# Patient Record
Sex: Male | Born: 1960 | Race: White | Hispanic: No | Smoking: Former smoker
Health system: Southern US, Community
[De-identification: ages and names within clinical notes are randomized; demographics above are authoritative.]

## PROBLEM LIST (undated history)

## (undated) DIAGNOSIS — I864 Gastric varices: Secondary | ICD-10-CM

## (undated) DIAGNOSIS — I2510 Atherosclerotic heart disease of native coronary artery without angina pectoris: Secondary | ICD-10-CM

## (undated) DIAGNOSIS — I5189 Other ill-defined heart diseases: Secondary | ICD-10-CM

## (undated) DIAGNOSIS — I509 Heart failure, unspecified: Secondary | ICD-10-CM

## (undated) DIAGNOSIS — I272 Pulmonary hypertension, unspecified: Secondary | ICD-10-CM

## (undated) DIAGNOSIS — E785 Hyperlipidemia, unspecified: Secondary | ICD-10-CM

## (undated) DIAGNOSIS — E119 Type 2 diabetes mellitus without complications: Secondary | ICD-10-CM

## (undated) DIAGNOSIS — J449 Chronic obstructive pulmonary disease, unspecified: Secondary | ICD-10-CM

## (undated) DIAGNOSIS — G4730 Sleep apnea, unspecified: Secondary | ICD-10-CM

## (undated) DIAGNOSIS — R188 Other ascites: Secondary | ICD-10-CM

## (undated) DIAGNOSIS — I8500 Esophageal varices without bleeding: Secondary | ICD-10-CM

## (undated) DIAGNOSIS — E669 Obesity, unspecified: Secondary | ICD-10-CM

## (undated) DIAGNOSIS — K5190 Ulcerative colitis, unspecified, without complications: Secondary | ICD-10-CM

## (undated) DIAGNOSIS — I10 Essential (primary) hypertension: Secondary | ICD-10-CM

## (undated) DIAGNOSIS — C4490 Unspecified malignant neoplasm of skin, unspecified: Secondary | ICD-10-CM

## (undated) DIAGNOSIS — N529 Male erectile dysfunction, unspecified: Secondary | ICD-10-CM

## (undated) DIAGNOSIS — K7460 Unspecified cirrhosis of liver: Secondary | ICD-10-CM

## (undated) DIAGNOSIS — R05 Cough: Secondary | ICD-10-CM

## (undated) DIAGNOSIS — D638 Anemia in other chronic diseases classified elsewhere: Principal | ICD-10-CM

## (undated) DIAGNOSIS — R0600 Dyspnea, unspecified: Secondary | ICD-10-CM

## (undated) DIAGNOSIS — D649 Anemia, unspecified: Secondary | ICD-10-CM

## (undated) DIAGNOSIS — R059 Cough, unspecified: Secondary | ICD-10-CM

## (undated) DIAGNOSIS — M549 Dorsalgia, unspecified: Secondary | ICD-10-CM

## (undated) DIAGNOSIS — C140 Malignant neoplasm of pharynx, unspecified: Secondary | ICD-10-CM

## (undated) HISTORY — DX: Unspecified malignant neoplasm of skin, unspecified: C44.90

## (undated) HISTORY — DX: Heart failure, unspecified: I50.9

## (undated) HISTORY — DX: Obesity, unspecified: E66.9

## (undated) HISTORY — DX: Anemia in other chronic diseases classified elsewhere: D63.8

## (undated) HISTORY — PX: KNEE ARTHROSCOPY: SUR90

## (undated) HISTORY — PX: ANGIOPLASTY: SHX39

## (undated) HISTORY — PX: TONSILLECTOMY: SUR1361

## (undated) HISTORY — DX: Ulcerative colitis, unspecified, without complications: K51.90

## (undated) HISTORY — PX: ROTATOR CUFF REPAIR: SHX139

## (undated) HISTORY — DX: Male erectile dysfunction, unspecified: N52.9

## (undated) HISTORY — DX: Sleep apnea, unspecified: G47.30

## (undated) HISTORY — DX: Atherosclerotic heart disease of native coronary artery without angina pectoris: I25.10

## (undated) HISTORY — PX: COLONOSCOPY: SHX174

## (undated) HISTORY — PX: NASAL SEPTUM SURGERY: SHX37

## (undated) HISTORY — DX: Dorsalgia, unspecified: M54.9

## (undated) HISTORY — DX: Cough: R05

## (undated) HISTORY — DX: Chronic obstructive pulmonary disease, unspecified: J44.9

## (undated) HISTORY — DX: Cough, unspecified: R05.9

## (undated) HISTORY — DX: Type 2 diabetes mellitus without complications: E11.9

## (undated) HISTORY — DX: Anemia, unspecified: D64.9

## (undated) HISTORY — PX: ESOPHAGOGASTRODUODENOSCOPY: SHX1529

## (undated) HISTORY — DX: Hyperlipidemia, unspecified: E78.5

## (undated) HISTORY — DX: Dyspnea, unspecified: R06.00

## (undated) HISTORY — DX: Essential (primary) hypertension: I10

---

## 1993-08-18 HISTORY — PX: CORONARY ARTERY BYPASS GRAFT: SHX141

## 2002-11-18 ENCOUNTER — Encounter: Admission: RE | Admit: 2002-11-18 | Discharge: 2002-11-18 | Payer: Self-pay | Admitting: Orthopaedic Surgery

## 2002-11-21 ENCOUNTER — Ambulatory Visit (HOSPITAL_BASED_OUTPATIENT_CLINIC_OR_DEPARTMENT_OTHER): Admission: RE | Admit: 2002-11-21 | Discharge: 2002-11-22 | Payer: Self-pay | Admitting: Orthopaedic Surgery

## 2004-01-04 ENCOUNTER — Ambulatory Visit (HOSPITAL_COMMUNITY): Admission: RE | Admit: 2004-01-04 | Discharge: 2004-01-04 | Payer: Self-pay | Admitting: Family Medicine

## 2005-08-18 HISTORY — PX: CHOLECYSTECTOMY: SHX55

## 2006-08-07 ENCOUNTER — Emergency Department (HOSPITAL_COMMUNITY): Admission: EM | Admit: 2006-08-07 | Discharge: 2006-08-07 | Payer: Self-pay | Admitting: Emergency Medicine

## 2009-08-22 ENCOUNTER — Ambulatory Visit (HOSPITAL_COMMUNITY)
Admission: RE | Admit: 2009-08-22 | Discharge: 2009-08-22 | Payer: Self-pay | Source: Home / Self Care | Admitting: Internal Medicine

## 2010-09-08 ENCOUNTER — Encounter: Payer: Self-pay | Admitting: Internal Medicine

## 2011-01-03 NOTE — Op Note (Signed)
NAME:  Antonio Stevens, Antonio Stevens                   ACCOUNT NO.:  [id number]   MEDICAL RECORD NO.:  [id number]                   PATIENT TYPE:  AMB   LOCATION:  DSC                                  FACILITY:  [facility]   PHYSICIAN:  Mark C. Ophelia Charter, M.D.                 DATE OF BIRTH:  [birth year]   DATE OF PROCEDURE:  11/21/2002  DATE OF DISCHARGE:                                 OPERATIVE REPORT   PREOPERATIVE DIAGNOSIS:  Left knee anterior cruciate ligament tear.   POSTOPERATIVE DIAGNOSIS:  Left knee anterior cruciate ligament tear with  posterior lateral meniscal tear.   PROCEDURE:  1. Diagnostic and operative arthroscopy, left knee.  2. Partial lateral meniscectomy.  3. Allograft bone-tendon-bone anterior cruciate ligament reconstruction.   SURGEON:  Mark C. Ophelia Charter, M.D.   ASSISTANT:  Genene Churn. Barry Dienes, P.A.-C.   ANESTHESIA:  General plus preoperative femoral nerve block plus 30 cc of  0.25% Marcaine with knee block  at the end of the case.   DESCRIPTION OF PROCEDURE:  After the induction of general anesthesia and  oral tracheal intubation, a proximal thigh tourniquet was placed over the  orange leg holder. Standard prepping was performed with Duraprep including  the toes before the stockinet and usual arthroscopic  sheets and drapes were  applied.   The arthroscope was placed. Inflow was placed thorough a superolateral  portal. The knee was examined. There was a positive Lachman, positive pivot  shift and still some laxity of the medial collateral ligament.   The arthroscope was introduced and the medial meniscus was normal. There  were some mild patellofemoral changes. No loose bodies. The lateral  compartment showed a tear of the posterior horn which was trimmed back with  straight flat baskets and then the Coude shaver.   The ACL showed detachment off the femoral side with scarring down wrapping  around behind the PCL. The ligament was carefully teased away from the PL  and  was resected due to the laxity and lack of the femoral attachment. The  stump was debrided and the assistant surgeon which was physician assistant  Zonia Kief, prepared the allograft by resecting the bone, sliding it  through a 10 sizer, drilling a hole and placing fiber wire  as well as #5  Tycron sutures through the hole on each end.   A 10-mm hole was drilled on the tibial side, exiting out through the stump  of the cruciate with care taken for positioning. A notchplasty was performed  and using the femoral notch handle, a pin was drilled up followed by  overdrilling with a 10-mm drill hole to a depth of 35.   The graft was marked with the sterile skin marker, and using the 2-pin  passer, the graft was passed up through the tibia hole through the knee. The  notcher was used on the femoral side. Nitinol wire was placed and a 7 x 25  mm  screw was inserted, countersunk 1 mm. Excellent grab on the graft. On the  tibial side there was slight prominence of the graft. The graft was  shortened slightly and it was felt best to tie it over a post.   Using a fiber wire sutured through the graft, the fiber wire was sutured to  the hole in the bone and then off of the #5 suture the 3 sutures were split  with 1 arm of the suture on each side tied around the post. The post was  tightened down holding the knot securely and then additional knots were  placed.   After irrigation with saline solution, 2-0 Vicryl sutures were placed in the  tibial tunnel hole. Nylon sutures and skin suture, some additional Marcaine  infiltration with a postoperative dressing and knee immobilizer.   The patient will stay overnight for IV pain medication due to his cardiac  condition. The patient intraoperatively received some nitroglycerin and also  some pressors to keep his blood pressure stable.                                               Mark C. Ophelia Charter, M.D.    MCY/MEDQ  D:  11/21/2002  T:  11/21/2002  Job:   [id number]

## 2012-09-22 ENCOUNTER — Other Ambulatory Visit (HOSPITAL_COMMUNITY): Payer: Self-pay | Admitting: Internal Medicine

## 2012-09-22 DIAGNOSIS — R945 Abnormal results of liver function studies: Secondary | ICD-10-CM

## 2012-09-22 DIAGNOSIS — R17 Unspecified jaundice: Secondary | ICD-10-CM

## 2012-09-24 ENCOUNTER — Ambulatory Visit (HOSPITAL_COMMUNITY)
Admission: RE | Admit: 2012-09-24 | Discharge: 2012-09-24 | Disposition: A | Discharge status: Home / Self Care | Payer: Medicare PPO | Source: Ambulatory Visit | Attending: Internal Medicine | Admitting: Internal Medicine

## 2012-09-24 DIAGNOSIS — R935 Abnormal findings on diagnostic imaging of other abdominal regions, including retroperitoneum: Secondary | ICD-10-CM | POA: Insufficient documentation

## 2012-09-24 DIAGNOSIS — R945 Abnormal results of liver function studies: Secondary | ICD-10-CM

## 2012-09-24 DIAGNOSIS — K769 Liver disease, unspecified: Secondary | ICD-10-CM | POA: Insufficient documentation

## 2012-09-24 DIAGNOSIS — R748 Abnormal levels of other serum enzymes: Secondary | ICD-10-CM | POA: Insufficient documentation

## 2012-09-24 DIAGNOSIS — R17 Unspecified jaundice: Secondary | ICD-10-CM

## 2012-10-04 ENCOUNTER — Other Ambulatory Visit (HOSPITAL_COMMUNITY): Payer: Self-pay | Admitting: Internal Medicine

## 2012-10-04 DIAGNOSIS — K7460 Unspecified cirrhosis of liver: Secondary | ICD-10-CM

## 2012-10-04 DIAGNOSIS — I81 Portal vein thrombosis: Secondary | ICD-10-CM

## 2012-10-05 ENCOUNTER — Other Ambulatory Visit (HOSPITAL_COMMUNITY): Payer: Self-pay | Admitting: Internal Medicine

## 2012-10-05 DIAGNOSIS — I81 Portal vein thrombosis: Secondary | ICD-10-CM

## 2012-10-06 ENCOUNTER — Ambulatory Visit (HOSPITAL_COMMUNITY)
Admission: RE | Admit: 2012-10-06 | Discharge: 2012-10-06 | Disposition: A | Discharge status: Home / Self Care | Payer: Medicare PPO | Source: Ambulatory Visit | Attending: Internal Medicine | Admitting: Internal Medicine

## 2012-10-06 DIAGNOSIS — I81 Portal vein thrombosis: Secondary | ICD-10-CM | POA: Insufficient documentation

## 2012-10-06 DIAGNOSIS — R109 Unspecified abdominal pain: Secondary | ICD-10-CM | POA: Insufficient documentation

## 2012-10-06 DIAGNOSIS — R161 Splenomegaly, not elsewhere classified: Secondary | ICD-10-CM | POA: Insufficient documentation

## 2012-10-06 MED ORDER — IOHEXOL 300 MG/ML  SOLN
100.0000 mL | Freq: Once | INTRAMUSCULAR | Status: AC | PRN
  Administered 2012-10-06: 100 mL via INTRAVENOUS

## 2013-03-03 ENCOUNTER — Other Ambulatory Visit: Payer: Self-pay | Admitting: Internal Medicine

## 2013-03-03 DIAGNOSIS — G479 Sleep disorder, unspecified: Secondary | ICD-10-CM

## 2013-03-10 ENCOUNTER — Other Ambulatory Visit (HOSPITAL_COMMUNITY): Payer: Self-pay | Admitting: Internal Medicine

## 2013-03-10 DIAGNOSIS — R1310 Dysphagia, unspecified: Secondary | ICD-10-CM

## 2013-03-16 ENCOUNTER — Ambulatory Visit (HOSPITAL_COMMUNITY)
Admission: RE | Admit: 2013-03-16 | Discharge: 2013-03-16 | Disposition: A | Discharge status: Home / Self Care | Payer: Medicare PPO | Source: Ambulatory Visit | Attending: Internal Medicine | Admitting: Internal Medicine

## 2013-03-16 DIAGNOSIS — R1310 Dysphagia, unspecified: Secondary | ICD-10-CM | POA: Insufficient documentation

## 2013-03-16 DIAGNOSIS — R6889 Other general symptoms and signs: Secondary | ICD-10-CM | POA: Insufficient documentation

## 2013-03-28 ENCOUNTER — Ambulatory Visit: Payer: Medicare PPO | Attending: Internal Medicine | Admitting: Sleep Medicine

## 2013-03-28 DIAGNOSIS — G4733 Obstructive sleep apnea (adult) (pediatric): Secondary | ICD-10-CM | POA: Insufficient documentation

## 2013-03-28 DIAGNOSIS — G479 Sleep disorder, unspecified: Secondary | ICD-10-CM

## 2013-03-29 NOTE — Patient Instructions (Signed)
CPAP and BIPAP CPAP and BIPAP are methods of helping you breathe. CPAP stands for "continuous positive airway pressure." BIPAP stands for "bi-level positive airway pressure." Both CPAP and BIPAP are provided by a small machine with a flexible plastic tube that attaches to a plastic mask that goes over your nose or mouth. Air is blown into your air passages through your nose or mouth. This helps to keep your airways open and helps to keep you breathing well. The amount of pressure that is used to blow the air into your air passages can be set on the machine. The pressure setting is based on your needs. With CPAP, the amount of pressure stays the same while you breathe in and out. With BIPAP, the amount of pressure changes when you inhale and exhale. Your caregiver will recommend whether CPAP or BIPAP would be more helpful for you.  CPAP and BIPAP can be helpful for both adults and children with:  Sleep apnea.  Chronic Obstructive Pulmonary Disease (COPD), a condition like emphysema.  Diseases which weaken the muscles of the chest such as muscular dystrophy or neurological diseases.  Other problems that cause breathing to be weak or difficult. USE OF CPAP OR BIPAP The respiratory therapist or technician will help you get used to wearing the mask. Some people feel claustrophobic (a trapped or closed in feeling) at first, because the mask needs to be fairly snug on your face.   It may help you to get used to the mask gradually, by first holding the mask loosely over your nose or mouth using a low pressure setting on the machine. Gradually the mask can be applied more snugly with increased pressure. You can also gradually increase the amount of time the mask is used.  People with sleep apnea will use the mask and machine at night when they are sleeping. Others, like those with ALS or other breathing difficulties, may need the CPAP or BIPAP all the time.  If the first mask you try does not fit well, or  is uncomfortable, there are other types and sizes that can be tried.  If you tend to breathe through your mouth, a chin strap may be applied to help keep your mouth closed (if you are using a nasal mask).  The CPAP and BIPAP machines have alarms that may sound if the mask comes off or develops a leak.  You should not eat or drink while the CPAP or BIPAP is on. Food or fluids could get pushed into your lungs by the pressure of the CPAP or BIPAP. Sometimes CPAP or BIPAP machines are ordered for home use. If you are going to use the CPAP or BIPAP machine at home, follow these instructions  CPAP or BIPAP machines can be rented or purchased through home health care companies. There are many different brands of machines available. If you rent a machine before purchasing you may find which particular machine works well for you.  Ask questions if there is something you do not understand when picking out your machine.  Place your CPAP or BIPAP machine on a secure table or stand near an electrical outlet.  Know where the On/Off switch is.  Follow your doctor's instructions for how to set the pressure on your machine and when you should use it.  Do not smoke! Tobacco smoke residue can damage the machine. SEEK IMMEDIATE MEDICAL CARE IF:   You have redness or open areas around your nose or mouth.  You have trouble operating   the CPAP or BIPAP machine.  You cannot tolerate wearing the CPAP or BIPAP mask.  You have any questions or concerns. Document Released: 05/02/2004 Document Revised: 10/27/2011 Document Reviewed: 08/01/2008 [employer] Patient Information 2014 [employer].  

## 2013-03-31 NOTE — Procedures (Signed)
[facility] [name], MD     [email or web address]        [name], [name]              ACCOUNT NO.:  [id number]  MEDICAL RECORD NO.:  [id number]          PATIENT TYPE:  OUT  LOCATION:  SLEEP LAB                     FACILITY:  [facility]  PHYSICIAN:  Andrae Claunch A. Gerilyn Pilgrim, M.D. DATE OF BIRTH:  09/25/1960  DATE OF STUDY:  03/28/2013                           NOCTURNAL POLYSOMNOGRAM  REFERRING PHYSICIAN:  Catalina Pizza, M.D.  INDICATION:  A 52 year old man who presents with hypersomnia, fatigue, obesity and snoring.   MEDICATIONS:  Hydrochlorothiazide, aspirin, Nexium, fluoxetine, folic acid, diclofenac and Actos.  EPWORTH SLEEPINESS SCALE:  12.  BMI:  40.  ARCHITECTURAL SUMMARY:  This is a split-night recording with initial portion being a diagnostic and the second portion a titration recording. The total recording time is 384 minutes.  Sleep efficiency is 7%, sleep latency is 16 minutes, REM latency is 254 minutes.  RESPIRATORY SUMMARY:  Baseline oxygen saturation is 94, lowest saturation 73 during non-REM sleep, although he did decide to 80 during REM sleep.  Diagnostic AHI is 73.  The patient was placed on positive pressure starting at 5 and increased to 12.  The optimal pressure is 12 with resolution of obstructive events.  The patient did have persistent desaturations to the 80s and as low as 81 even on the high positive pressure.  These desaturations were without obstructive events indicative of hypoventilation syndrome.  LIMB MOVEMENT SUMMARY:  PLM index 0.  ELECTROCARDIOGRAM SUMMARY:  No significant dysrhythmias are observed.  IMPRESSION: 1. Severe obstructive sleep apnea syndrome which responds well to a     CPAP of 12. 2. REM related hypoventilation syndrome.  RECOMMENDATION:  CPAP of 12.  However, given the REM related hypoventilation syndrome, the patient should have a nocturnal oximetry on positive pressure after a month's treatment to assess the  need for nocturnal oxygen. If he meets Medicare criteria for nocturnal oxygen, he patient should be placed on supplemental 2 L of oxygen at nighttime along with his CPAP.  Thank you for this referral.  SLEEP ARCHITECTURE:  RESPIRATORY DATA:  OXYGEN DATA:  CARDIAC DATA:  MOVEMENT-PARASOMNIA:  IMPRESSIONS-RECOMMENDATIONS:     Adonay Scheier A. Gerilyn Pilgrim, M.D.    KAD/MEDQ  D:  03/31/2013 08:44:13  T:  03/31/2013 08:57:07  Job:  [id number]

## 2013-04-14 ENCOUNTER — Ambulatory Visit (HOSPITAL_COMMUNITY): Payer: Medicare Other

## 2013-04-15 ENCOUNTER — Encounter (HOSPITAL_COMMUNITY): Payer: Self-pay

## 2013-04-15 ENCOUNTER — Encounter (HOSPITAL_COMMUNITY): Payer: Medicare PPO | Attending: Hematology and Oncology

## 2013-04-15 VITALS — BP 127/69 | HR 79 | Temp 98.8°F | Resp 20 | Ht 72.0 in | Wt 283.7 lb

## 2013-04-15 DIAGNOSIS — D649 Anemia, unspecified: Secondary | ICD-10-CM

## 2013-04-15 DIAGNOSIS — E782 Mixed hyperlipidemia: Secondary | ICD-10-CM

## 2013-04-15 DIAGNOSIS — E755 Other lipid storage disorders: Secondary | ICD-10-CM

## 2013-04-15 LAB — COMPREHENSIVE METABOLIC PANEL
ALT: 22 U/L (ref 0–53)
AST: 30 U/L (ref 0–37)
Calcium: 9.2 mg/dL (ref 8.4–10.5)
GFR calc Af Amer: >90 mL/min (ref >90)
GFR calc non Af Amer: >90 mL/min (ref >90)
Total Bilirubin: 1.1 mg/dL (ref 0.3–1.2)
Total Protein: 7.4 g/dL (ref 6.0–8.3)

## 2013-04-15 LAB — HAPTOGLOBIN: Haptoglobin: 54 mg/dL (ref 45–215)

## 2013-04-15 LAB — CBC WITH DIFFERENTIAL/PLATELET
Eosinophils Absolute: 0.2 10*3/uL (ref 0.0–0.7)
Eosinophils Relative: 4 % (ref 0–5)
HCT: 28.1 % — ABNORMAL LOW (ref 39.0–52.0)
Hemoglobin: 10.1 g/dL — ABNORMAL LOW (ref 13.0–17.0)
Lymphocytes Relative: 28 % (ref 12–46)
MCV: 121.6 fL — ABNORMAL HIGH (ref 78.0–100.0)
Monocytes Relative: 20 % — ABNORMAL HIGH (ref 3–12)
WBC: 4.4 10*3/uL (ref 4.0–10.5)

## 2013-04-15 LAB — IRON AND TIBC
Saturation Ratios: 26 % (ref 20–55)
TIBC: 397 ug/dL (ref 215–435)

## 2013-04-15 LAB — LACTATE DEHYDROGENASE: LDH: 215 U/L (ref 94–250)

## 2013-04-15 NOTE — Progress Notes (Signed)
Patient History and Physical   Antonio Stevens [id number] 1961/04/19 52 y.o. 04/15/2013  Referring MD: Catalina Pizza, MD.  Chief Complaint: Anemia.   HPI:  Antonio Stevens is a 52 year old man with history of multiple co morbidities listed below under past medical history.  We were kindly asked by [name] to evaluate him for anemia.  I have reviewed the records from Dr [name] office.  Patient more recently on 03/01/2013 was noted to have a hemoglobin of 10.8, with essentially normal WBC and platelet count. Differential counts were essentially unremarkable. In Jan 2014  Hb was  Near normal at 12.9 g/dL . Approximately  in September of 2013 , hemoglobin was normal at 14 with essentially normal WBC and platelets also and normal differentials. He tells me that he has had " fluid build up" in his body and has been working with his cardiologist at [facility] optimize his diuretic therapy. He states that she feels tired and chronically short of breath even though he smokes one pack of cigarettes a day.  He denies  bloody stool or melena or hematuria.  He did state that  He had Ulcerative colitis that is under good control for the past 3-4 years. He does not recall  exactly when his last colonoscopy was but he states that he is not due for one at this time.  PMH: Past Medical History  Diagnosis Date  . CHF (congestive heart failure)   . CAD (coronary artery disease)   . Hypertension   . Anemia   . Hyperlipidemia   . Sleep apnea   . Obesity   . Diabetes mellitus without complication   . Ulcerative colitis   . Dyspnea   . Cough   . Back pain   . COPD (chronic obstructive pulmonary disease)   . Erectile dysfunction   . Skin cancer     Past Surgical History  Procedure Laterality Date  . Cholecystectomy  2007  . Coronary artery bypass graft  1995  . Angioplasty      Allergies: No Known Allergies  Medications: Current outpatient prescriptions:albuterol (PROVENTIL HFA;VENTOLIN HFA) 108 (90 BASE)  MCG/ACT inhaler, Inhale 2 puffs into the lungs every 6 (six) hours as needed for wheezing., Disp: , Rfl: ;  aspirin EC 81 MG tablet, Take 81 mg by mouth daily., Disp: , Rfl: ;  atorvastatin (LIPITOR) 40 MG tablet, Take 40 mg by mouth daily., Disp: , Rfl: ;  carvedilol (COREG) 3.125 MG tablet, Take 3.125 mg by mouth 2 (two) times daily with a meal., Disp: , Rfl:  docusate sodium (COLACE) 100 MG capsule, Take 100 mg by mouth daily., Disp: , Rfl: ;  esomeprazole (NEXIUM) 40 MG capsule, Take 40 mg by mouth daily before breakfast., Disp: , Rfl: ;  FLUoxetine (PROZAC) 20 MG capsule, Take 20 mg by mouth daily., Disp: , Rfl: ;  fluticasone (FLONASE) 50 MCG/ACT nasal spray, Place 2 sprays into the nose 2 (two) times daily as needed for rhinitis., Disp: , Rfl:  folic acid (FOLVITE) 1 MG tablet, Take 1 mg by mouth daily., Disp: , Rfl: ;  mercaptopurine (PURINETHOL) 50 MG tablet, Take 50 mg by mouth daily. 2 pills daily. Give on an empty stomach 1 hour before or 2 hours after meals. Caution: Chemotherapy., Disp: , Rfl: ;  nitroGLYCERIN (NITROSTAT) 0.4 MG SL tablet, Place 0.4 mg under the tongue every 5 (five) minutes as needed for chest pain., Disp: , Rfl:  pioglitazone (ACTOS) 30 MG tablet, Take 30 mg by mouth  daily., Disp: , Rfl: ;  spironolactone (ALDACTONE) 25 MG tablet, Take 12.5 mg by mouth daily. Take one half 25 mg tablet daily, Disp: , Rfl: ;  tadalafil (CIALIS) 5 MG tablet, Take 5 mg by mouth as needed for erectile dysfunction., Disp: , Rfl: ;  torsemide (DEMADEX) 20 MG tablet, Take 40 mg by mouth 2 (two) times daily. Take two 20 mg (40 mg) two times daily, Disp: , Rfl:  vardenafil (LEVITRA) 20 MG tablet, Take 20 mg by mouth as needed for erectile dysfunction., Disp: , Rfl: ;  vitamin B-12 (CYANOCOBALAMIN) 1000 MCG tablet, Take 1,000 mcg by mouth daily., Disp: , Rfl:    Social History:   reports that he has been smoking Cigarettes.  He has been smoking about 1.00 pack per day. He has never used smokeless  tobacco. He reports that he does not drink alcohol or use illicit drugs.  Has at least 60 pack years of smoking. Lives alone. Worked as a Higher education careers adviser. Divorced. No children. Has a brother and 2 sister that live within 15 miles. In good term with his sister and talk to her regularly [name] [phone])  Family History: Family History  Problem Relation Age of Onset  . Heart attack Father   . Diabetes Brother   . Heart attack Brother   . Emphysema Maternal Grandfather     Review of Systems: 14 point review of system is as in the history above otherwise negative.   Physical Exam: Blood pressure 127/69, pulse 79, temperature 98.8 F (37.1 C), temperature source Oral, resp. rate 20, height 6' (1.829 m), weight 283 lb 11.2 oz (128.685 kg). GENERAL: No distress, . Obese.  SKIN:  No rashes or significant lesions , no ecchymosis or petechia or rash. 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 HEAD: Normocephalic, No masses, lesions, tenderness or abnormalities  EYES: Conjunctiva are pink , non-injected and no jaundice. ENT: External ears normal ,lips, buccal mucosa, and tongue normal and mucous membranes are moist . LYMPH: No palpable lymphadenopathy,  In the neck supraclavicular area. LUNGS: Clear to auscultation , no crackles or wheezes HEART: regular rate & rhythm, no murmurs, no gallops, S1 normal and S2 normal  ABDOMEN: Abdomen soft, non-tender, normal bowel sounds, no masses or organomegaly and no hepatosplenomegaly palpable.  EXTREMITIES: No edema, no skin discoloration or tenderness. NEURO: Alert & oriented.     Lab Results: No results found for this basename: WBC, HGB, HCT, MCV, PLT     Chemistry   No results found for this basename: NA, K, CL, CO2, BUN, CREATININE, GLU   No results found for this basename: CALCIUM, ALKPHOS, AST, ALT, BILITOT         Radiological Studies: No results found.    Impression: Antonio Stevens has relatively new onset anemia,  etiology of which is unclear from available data.  I will initiate basic anemia work up which will include iron studies, B12 level and hemolytic workup.  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  Recommendations: 1. CBC ,CMP, B12, LDH, haptoglobin, serum protein electrophoresis and light chain assay. 2.  I will review peripheral blood smear. 3. Patient will return to clinic in 2 weeks to discuss the results and see if further workup is indicated. 4. He will continue his routine care with Dr. Timothy Lasso and [facility]. 5.  Further recommendations pending follow up visit.  All questions were satisfactorily answered.She knows to call if she has any concern.  I spent 50% of the time was spent counseling the patient face to face. The total time spent in the appointment was 60 minutes.   Sherral Hammers, MD FACP. Hematology/Oncology.   Thank you [name] for the opportunity to participate in the care of this patient.  Marland Kitchen

## 2013-04-15 NOTE — Patient Instructions (Addendum)
[facility] Discharge Instructions  RECOMMENDATIONS MADE BY THE CONSULTANT AND ANY TEST RESULTS WILL BE SENT TO YOUR REFERRING PHYSICIAN.  EXAM FINDINGS BY THE PHYSICIAN TODAY AND SIGNS OR SYMPTOMS TO REPORT TO CLINIC OR PRIMARY PHYSICIAN: Exam and discussion by MD.  We will check some blood work today and will see you back in 2 weeks to discuss results.  MEDICATIONS PRESCRIBED:  none    SPECIAL INSTRUCTIONS/FOLLOW-UP: Follow-up in 2 weeks.  Thank you for choosing [facility] to provide your oncology and hematology care.  To afford each patient quality time with our providers, please arrive at least 15 minutes before your scheduled appointment time.  With your help, our goal is to use those 15 minutes to complete the necessary work-up to ensure our physicians have the information they need to help with your evaluation and healthcare recommendations.    Effective January 1st, 2014, we ask that you re-schedule your appointment with our physicians should you arrive 10 or more minutes late for your appointment.  We strive to give you quality time with our providers, and arriving late affects you and other patients whose appointments are after yours.    Again, thank you for choosing [facility].  Our hope is that these requests will decrease the amount of time that you wait before being seen by our physicians.       _____________________________________________________________  Should you have questions after your visit to [facility], please contact our office at [phone] between the hours of 8:30 a.m. and 5:00 p.m.  Voicemails left after 4:30 p.m. will not be returned until the following business day.  For prescription refill requests, have your pharmacy contact our office with your prescription refill request.

## 2013-04-15 NOTE — Progress Notes (Signed)
Antonio Stevens presented for labwork. Labs per MD order drawn via Peripheral Line 23 gauge needle inserted in right AC  Good blood return present. Procedure without incident.  Needle removed intact. Patient tolerated procedure well.

## 2013-04-16 LAB — FERRITIN: Ferritin: 104 ng/mL (ref 22–322)

## 2013-04-19 LAB — KAPPA/LAMBDA LIGHT CHAINS
Kappa free light chain: 3.26 mg/dL — ABNORMAL HIGH (ref 0.33–1.94)
Kappa, lambda light chain ratio: 1.25 (ref 0.26–1.65)
Lambda free light chains: 2.6 mg/dL (ref 0.57–2.63)

## 2013-04-20 LAB — IMMUNOFIXATION ELECTROPHORESIS: Total Protein ELP: 6 g/dL (ref 6.0–8.3)

## 2013-04-29 ENCOUNTER — Encounter (HOSPITAL_COMMUNITY): Payer: Self-pay

## 2013-04-29 ENCOUNTER — Encounter (HOSPITAL_COMMUNITY): Payer: Medicare PPO | Attending: Hematology and Oncology

## 2013-04-29 VITALS — BP 95/56 | HR 69 | Temp 98.4°F | Resp 20 | Wt 263.2 lb

## 2013-04-29 DIAGNOSIS — D638 Anemia in other chronic diseases classified elsewhere: Secondary | ICD-10-CM | POA: Insufficient documentation

## 2013-04-29 LAB — CBC WITH DIFFERENTIAL/PLATELET
Basophils Relative: 1 % (ref 0–1)
Eosinophils Absolute: 0.2 10*3/uL (ref 0.0–0.7)
HCT: 34.6 % — ABNORMAL LOW (ref 39.0–52.0)
Hemoglobin: 12.7 g/dL — ABNORMAL LOW (ref 13.0–17.0)
MCH: 43.2 pg — ABNORMAL HIGH (ref 26.0–34.0)
MCHC: 36.7 g/dL — ABNORMAL HIGH (ref 30.0–36.0)
Monocytes Absolute: 0.8 10*3/uL (ref 0.1–1.0)
Monocytes Relative: 15 % — ABNORMAL HIGH (ref 3–12)
Neutro Abs: 2.4 10*3/uL (ref 1.7–7.7)

## 2013-04-29 NOTE — Patient Instructions (Signed)
[facility] Discharge Instructions  RECOMMENDATIONS MADE BY THE CONSULTANT AND ANY TEST RESULTS WILL BE SENT TO YOUR REFERRING PHYSICIAN.  EXAM FINDINGS BY THE PHYSICIAN TODAY AND SIGNS OR SYMPTOMS TO REPORT TO CLINIC OR PRIMARY PHYSICIAN: Exam findings as discussed by Dr. Sharia Reeve.  SPECIAL INSTRUCTIONS/FOLLOW-UP: 1.  Labs performed today, and we'll contact you with any abnormal results. 2.  Please return to the clinic for an office visit again in 3 months as scheduled.  Contact us sooner with questions or concerns.  Thank you for choosing [facility] to provide your oncology and hematology care.  To afford each patient quality time with our providers, please arrive at least 15 minutes before your scheduled appointment time.  With your help, our goal is to use those 15 minutes to complete the necessary work-up to ensure our physicians have the information they need to help with your evaluation and healthcare recommendations.    Effective January 1st, 2014, we ask that you re-schedule your appointment with our physicians should you arrive 10 or more minutes late for your appointment.  We strive to give you quality time with our providers, and arriving late affects you and other patients whose appointments are after yours.    Again, thank you for choosing [facility].  Our hope is that these requests will decrease the amount of time that you wait before being seen by our physicians.       _____________________________________________________________  Should you have questions after your visit to [facility], please contact our office at [phone] between the hours of 8:30 a.m. and 5:00 p.m.  Voicemails left after 4:30 p.m. will not be returned until the following business day.  For prescription refill requests, have your pharmacy contact our office with your prescription refill request.

## 2013-04-29 NOTE — Progress Notes (Signed)
[facility] Telephone:([phone]) [phone]   [phone]  OFFICE PROGRESS NOTE  Catalina Pizza, MD [place] [address]  DIAGNOSIS: Anemia of chronic disease  INTERVAL HISTORY:   Antonio Stevens is a 52 year old man with history of multiple co morbidities listed below under past medical history. We were kindly asked by [name] to evaluate him for anemia. On 03/01/2013 was noted to have a hemoglobin of 10.8, with essentially normal WBC and platelet count. Differential counts were essentially unremarkable. In Jan 2014 Hb was Near normal at 12.9 g/dL . Approximately in September of 2013 , hemoglobin was normal at 14 with essentially normal WBC and platelets also and normal differentials.  After his initial visit, I had  initiated a workup for anemia. His hemoglobin then was 10.1, ferritin 104 with TIBC of 397.  Folate and B12 were normal.  Serum  protein electrophoresis and light chain assay were essentially unremarkable. LDH and haptoglobin also did not indicate hemolysis.  Review of peripheral blood smear below was also essentially unremarkable.  He returns to the clinic today for scheduled follow up and to review the results of his initial visit.  He tells me that he feels tired this morning which he attributes to not getting enough sleep last night otherwise he denies significant chronic fatigue .  He states that he is getting magnesium and potassium and this is helping his leg cramps.  He asked if he could use Tylenol for bee sting  which he gets periodically( I told him he could use this but also educated him in the signs of anaphylaxis from BEE STING and to call 911 if this happens)   MEDICAL HISTORY: Past Medical History  Diagnosis Date  . CHF (congestive heart failure)   . CAD (coronary artery disease)   . Hypertension   . Anemia   . Hyperlipidemia   . Sleep apnea   . Obesity   . Diabetes mellitus without complication   . Ulcerative colitis   . Dyspnea   . Cough   .  Back pain   . COPD (chronic obstructive pulmonary disease)   . Erectile dysfunction   . Skin cancer     ALLERGIES:  has No Known Allergies.  MEDICATIONS: Reviewed.  SURGICAL HISTORY:  Past Surgical History  Procedure Laterality Date  . Cholecystectomy  2007  . Coronary artery bypass graft  1995  . Angioplasty       REVIEW OF SYSTEMS: As above otherwise negative.  PHYSICAL EXAMINATION:  Blood pressure 95/56, pulse 69, temperature 98.4 F (36.9 C), temperature source Oral, resp. rate 20, weight 263 lb 3 oz (119.381 kg). GENERAL: No acute distress. NEURO: Alert & oriented     LABORATORY DATA: Results for Antonio Stevens, Antonio Stevens (MRN [id number]) as of 04/29/2013 20:12  Ref. Range 04/15/2013 16:30 04/29/2013 09:44  WBC Latest Range: 4.0-10.5 K/uL 4.4 5.5  RBC Latest Range: 4.22-5.81 MIL/uL 2.31 (L) 2.94 (L)  Hemoglobin Latest Range: 13.0-17.0 g/dL 95.6 (L) 21.3 (L)  HCT Latest Range: 39.0-52.0 % 28.1 (L) 34.6 (L)  MCV Latest Range: 78.0-100.0 fL 121.6 (H) 117.7 (H)  MCH Latest Range: 26.0-34.0 pg 43.7 (H) 43.2 (H)  MCHC Latest Range: 30.0-36.0 g/dL 08.6 57.8 (H)  RDW Latest Range: 11.5-15.5 % 16.2 (H) 15.7 (H)  Platelets Latest Range: 150-400 K/uL 138 (L) 189      Chemistry      Component Value Date/Time   NA 134* 04/15/2013 1630   K 3.7 04/15/2013 1630  CL 96 04/15/2013 1630   CO2 30 04/15/2013 1630   BUN 15 04/15/2013 1630   CREATININE 0.94 04/15/2013 1630      Component Value Date/Time   CALCIUM 9.2 04/15/2013 1630   ALKPHOS 78 04/15/2013 1630   AST 30 04/15/2013 1630   ALT 22 04/15/2013 1630   BILITOT 1.1 04/15/2013 1630       RADIOGRAPHIC STUDIES: No results found.   ASSESSMENT:  Patient  most likely has anemia of chronic inflammation. He is relatively asymptomatic. I think he can be observed for now. I told him that if he becomes more symptomatic in the future, ESA therapy would be indicated. His hemoglobin today is significantly improved from his last visit which  raises the possibility of a transient factor, however time will tell   PLAN:  1. Repeat CBC today make sure his hemoglobin is not dropping. Result is as documented above. 2. He will  otherwise return to clinic in 3 months. 3. Follow up with his other care providers as scheduled.     All questions were satisfactorily answered. Patient knows to call if  any concern arises.  I spent more than 50 % counseling the patient face to face. The total time spent in the appointment was 30 minutes.   Sherral Hammers, MD FACP. Hematology/Oncology.     Addendum: Review of peripheral blood smear showed platelet clumps, segmented neutrophils, mild anisocytosis otherwise normal RBC morphology.  No blast forms were seen. This is an essentially unremarkable blood smear.

## 2013-06-01 ENCOUNTER — Other Ambulatory Visit (HOSPITAL_COMMUNITY): Payer: Self-pay | Admitting: Internal Medicine

## 2013-06-02 ENCOUNTER — Ambulatory Visit (HOSPITAL_COMMUNITY)
Admission: RE | Admit: 2013-06-02 | Discharge: 2013-06-02 | Disposition: A | Discharge status: Home / Self Care | Payer: Medicare PPO | Source: Ambulatory Visit | Attending: Internal Medicine | Admitting: Internal Medicine

## 2013-06-02 ENCOUNTER — Other Ambulatory Visit (HOSPITAL_COMMUNITY): Payer: Self-pay | Admitting: Internal Medicine

## 2013-06-02 DIAGNOSIS — R220 Localized swelling, mass and lump, head: Secondary | ICD-10-CM | POA: Insufficient documentation

## 2013-06-02 MED ORDER — IOHEXOL 300 MG/ML  SOLN
80.0000 mL | Freq: Once | INTRAMUSCULAR | Status: AC | PRN
  Administered 2013-06-02: 80 mL via INTRAVENOUS

## 2013-06-16 ENCOUNTER — Ambulatory Visit (INDEPENDENT_AMBULATORY_CARE_PROVIDER_SITE_OTHER): Payer: Medicare PPO | Admitting: Otolaryngology

## 2013-06-16 DIAGNOSIS — J351 Hypertrophy of tonsils: Secondary | ICD-10-CM

## 2013-06-16 DIAGNOSIS — R1312 Dysphagia, oropharyngeal phase: Secondary | ICD-10-CM

## 2013-06-20 ENCOUNTER — Other Ambulatory Visit: Payer: Self-pay | Admitting: Otolaryngology

## 2013-06-23 ENCOUNTER — Other Ambulatory Visit: Payer: Self-pay

## 2013-07-01 ENCOUNTER — Ambulatory Visit (HOSPITAL_COMMUNITY): Admission: RE | Admit: 2013-07-01 | Payer: Medicare PPO | Source: Ambulatory Visit | Admitting: Otolaryngology

## 2013-07-01 ENCOUNTER — Encounter (HOSPITAL_COMMUNITY): Admission: RE | Payer: Self-pay | Source: Ambulatory Visit

## 2013-07-01 SURGERY — TONSILLECTOMY
Anesthesia: General | Laterality: Right

## 2013-07-27 ENCOUNTER — Ambulatory Visit (HOSPITAL_BASED_OUTPATIENT_CLINIC_OR_DEPARTMENT_OTHER): Admit: 2013-07-27 | Payer: Self-pay | Admitting: Otolaryngology

## 2013-07-27 ENCOUNTER — Encounter (HOSPITAL_BASED_OUTPATIENT_CLINIC_OR_DEPARTMENT_OTHER): Payer: Self-pay

## 2013-07-27 SURGERY — TONSILLECTOMY
Anesthesia: General | Laterality: Right

## 2013-07-28 ENCOUNTER — Encounter (HOSPITAL_COMMUNITY): Payer: Self-pay | Admitting: Oncology

## 2013-07-28 DIAGNOSIS — D638 Anemia in other chronic diseases classified elsewhere: Secondary | ICD-10-CM | POA: Insufficient documentation

## 2013-07-28 HISTORY — DX: Anemia in other chronic diseases classified elsewhere: D63.8

## 2013-07-28 NOTE — Progress Notes (Signed)
-   No show, letter sent- KEFALAS,THOMAS   

## 2013-07-29 ENCOUNTER — Ambulatory Visit (HOSPITAL_COMMUNITY): Payer: Medicare Other | Admitting: Oncology

## 2013-07-29 ENCOUNTER — Other Ambulatory Visit (HOSPITAL_COMMUNITY): Payer: Medicare Other

## 2013-09-01 ENCOUNTER — Encounter (HOSPITAL_COMMUNITY): Payer: Self-pay | Admitting: Oncology

## 2013-10-07 ENCOUNTER — Encounter (HOSPITAL_COMMUNITY): Payer: Self-pay | Admitting: Oncology

## 2014-02-06 ENCOUNTER — Encounter (HOSPITAL_COMMUNITY): Payer: Self-pay | Admitting: Oncology

## 2014-07-18 HISTORY — PX: CORONARY STENT PLACEMENT: SHX1402

## 2014-10-23 ENCOUNTER — Encounter (INDEPENDENT_AMBULATORY_CARE_PROVIDER_SITE_OTHER): Payer: Self-pay | Admitting: *Deleted

## 2014-11-14 ENCOUNTER — Ambulatory Visit (INDEPENDENT_AMBULATORY_CARE_PROVIDER_SITE_OTHER): Payer: Medicare PPO | Admitting: Internal Medicine

## 2015-05-03 ENCOUNTER — Inpatient Hospital Stay (HOSPITAL_COMMUNITY)
Admission: EM | Admit: 2015-05-03 | Discharge: 2015-05-19 | DRG: 432 | Disposition: E | Payer: Medicare PPO | Attending: Internal Medicine | Admitting: Internal Medicine

## 2015-05-03 ENCOUNTER — Emergency Department (HOSPITAL_COMMUNITY): Payer: Medicare PPO

## 2015-05-03 ENCOUNTER — Encounter (HOSPITAL_COMMUNITY): Payer: Self-pay | Admitting: Emergency Medicine

## 2015-05-03 ENCOUNTER — Observation Stay (HOSPITAL_COMMUNITY): Payer: Medicare PPO

## 2015-05-03 DIAGNOSIS — R633 Feeding difficulties: Secondary | ICD-10-CM | POA: Diagnosis present

## 2015-05-03 DIAGNOSIS — R945 Abnormal results of liver function studies: Secondary | ICD-10-CM | POA: Diagnosis not present

## 2015-05-03 DIAGNOSIS — Z66 Do not resuscitate: Secondary | ICD-10-CM | POA: Diagnosis present

## 2015-05-03 DIAGNOSIS — Z955 Presence of coronary angioplasty implant and graft: Secondary | ICD-10-CM

## 2015-05-03 DIAGNOSIS — K7682 Hepatic encephalopathy: Secondary | ICD-10-CM | POA: Diagnosis not present

## 2015-05-03 DIAGNOSIS — I272 Other secondary pulmonary hypertension: Secondary | ICD-10-CM | POA: Diagnosis present

## 2015-05-03 DIAGNOSIS — K7030 Alcoholic cirrhosis of liver without ascites: Secondary | ICD-10-CM | POA: Diagnosis present

## 2015-05-03 DIAGNOSIS — Z79899 Other long term (current) drug therapy: Secondary | ICD-10-CM

## 2015-05-03 DIAGNOSIS — R531 Weakness: Secondary | ICD-10-CM | POA: Diagnosis not present

## 2015-05-03 DIAGNOSIS — K831 Obstruction of bile duct: Secondary | ICD-10-CM

## 2015-05-03 DIAGNOSIS — D539 Nutritional anemia, unspecified: Secondary | ICD-10-CM | POA: Diagnosis present

## 2015-05-03 DIAGNOSIS — J690 Pneumonitis due to inhalation of food and vomit: Secondary | ICD-10-CM | POA: Diagnosis not present

## 2015-05-03 DIAGNOSIS — D638 Anemia in other chronic diseases classified elsewhere: Secondary | ICD-10-CM | POA: Diagnosis present

## 2015-05-03 DIAGNOSIS — K7200 Acute and subacute hepatic failure without coma: Secondary | ICD-10-CM | POA: Diagnosis not present

## 2015-05-03 DIAGNOSIS — Z6831 Body mass index (BMI) 31.0-31.9, adult: Secondary | ICD-10-CM

## 2015-05-03 DIAGNOSIS — K7040 Alcoholic hepatic failure without coma: Secondary | ICD-10-CM | POA: Diagnosis present

## 2015-05-03 DIAGNOSIS — K7290 Hepatic failure, unspecified without coma: Secondary | ICD-10-CM

## 2015-05-03 DIAGNOSIS — R319 Hematuria, unspecified: Secondary | ICD-10-CM

## 2015-05-03 DIAGNOSIS — K5190 Ulcerative colitis, unspecified, without complications: Secondary | ICD-10-CM | POA: Diagnosis present

## 2015-05-03 DIAGNOSIS — Z833 Family history of diabetes mellitus: Secondary | ICD-10-CM

## 2015-05-03 DIAGNOSIS — Z825 Family history of asthma and other chronic lower respiratory diseases: Secondary | ICD-10-CM

## 2015-05-03 DIAGNOSIS — K766 Portal hypertension: Secondary | ICD-10-CM | POA: Diagnosis present

## 2015-05-03 DIAGNOSIS — G4730 Sleep apnea, unspecified: Secondary | ICD-10-CM | POA: Diagnosis present

## 2015-05-03 DIAGNOSIS — D689 Coagulation defect, unspecified: Secondary | ICD-10-CM | POA: Diagnosis present

## 2015-05-03 DIAGNOSIS — K7031 Alcoholic cirrhosis of liver with ascites: Secondary | ICD-10-CM | POA: Diagnosis not present

## 2015-05-03 DIAGNOSIS — J9601 Acute respiratory failure with hypoxia: Secondary | ICD-10-CM | POA: Diagnosis not present

## 2015-05-03 DIAGNOSIS — F1020 Alcohol dependence, uncomplicated: Secondary | ICD-10-CM | POA: Diagnosis present

## 2015-05-03 DIAGNOSIS — I5032 Chronic diastolic (congestive) heart failure: Secondary | ICD-10-CM | POA: Diagnosis present

## 2015-05-03 DIAGNOSIS — R1084 Generalized abdominal pain: Secondary | ICD-10-CM | POA: Diagnosis present

## 2015-05-03 DIAGNOSIS — E872 Acidosis: Secondary | ICD-10-CM | POA: Diagnosis present

## 2015-05-03 DIAGNOSIS — J449 Chronic obstructive pulmonary disease, unspecified: Secondary | ICD-10-CM | POA: Diagnosis present

## 2015-05-03 DIAGNOSIS — Z951 Presence of aortocoronary bypass graft: Secondary | ICD-10-CM

## 2015-05-03 DIAGNOSIS — C140 Malignant neoplasm of pharynx, unspecified: Secondary | ICD-10-CM | POA: Diagnosis not present

## 2015-05-03 DIAGNOSIS — Z7982 Long term (current) use of aspirin: Secondary | ICD-10-CM

## 2015-05-03 DIAGNOSIS — Z515 Encounter for palliative care: Secondary | ICD-10-CM

## 2015-05-03 DIAGNOSIS — R109 Unspecified abdominal pain: Secondary | ICD-10-CM

## 2015-05-03 DIAGNOSIS — D61818 Other pancytopenia: Secondary | ICD-10-CM | POA: Diagnosis present

## 2015-05-03 DIAGNOSIS — R188 Other ascites: Secondary | ICD-10-CM | POA: Diagnosis present

## 2015-05-03 DIAGNOSIS — E785 Hyperlipidemia, unspecified: Secondary | ICD-10-CM | POA: Diagnosis present

## 2015-05-03 DIAGNOSIS — J9602 Acute respiratory failure with hypercapnia: Secondary | ICD-10-CM | POA: Diagnosis not present

## 2015-05-03 DIAGNOSIS — E871 Hypo-osmolality and hyponatremia: Secondary | ICD-10-CM | POA: Diagnosis present

## 2015-05-03 DIAGNOSIS — Z8249 Family history of ischemic heart disease and other diseases of the circulatory system: Secondary | ICD-10-CM

## 2015-05-03 DIAGNOSIS — Z87891 Personal history of nicotine dependence: Secondary | ICD-10-CM

## 2015-05-03 DIAGNOSIS — Z923 Personal history of irradiation: Secondary | ICD-10-CM

## 2015-05-03 DIAGNOSIS — Z8719 Personal history of other diseases of the digestive system: Secondary | ICD-10-CM

## 2015-05-03 DIAGNOSIS — G8929 Other chronic pain: Secondary | ICD-10-CM | POA: Diagnosis present

## 2015-05-03 DIAGNOSIS — G9340 Encephalopathy, unspecified: Secondary | ICD-10-CM | POA: Diagnosis not present

## 2015-05-03 DIAGNOSIS — Z7902 Long term (current) use of antithrombotics/antiplatelets: Secondary | ICD-10-CM

## 2015-05-03 DIAGNOSIS — J189 Pneumonia, unspecified organism: Secondary | ICD-10-CM

## 2015-05-03 DIAGNOSIS — I8510 Secondary esophageal varices without bleeding: Secondary | ICD-10-CM | POA: Diagnosis present

## 2015-05-03 DIAGNOSIS — K51911 Ulcerative colitis, unspecified with rectal bleeding: Secondary | ICD-10-CM | POA: Diagnosis present

## 2015-05-03 DIAGNOSIS — I350 Nonrheumatic aortic (valve) stenosis: Secondary | ICD-10-CM | POA: Diagnosis present

## 2015-05-03 DIAGNOSIS — J9690 Respiratory failure, unspecified, unspecified whether with hypoxia or hypercapnia: Secondary | ICD-10-CM

## 2015-05-03 DIAGNOSIS — I2510 Atherosclerotic heart disease of native coronary artery without angina pectoris: Secondary | ICD-10-CM | POA: Diagnosis present

## 2015-05-03 DIAGNOSIS — R7989 Other specified abnormal findings of blood chemistry: Secondary | ICD-10-CM | POA: Diagnosis present

## 2015-05-03 DIAGNOSIS — E669 Obesity, unspecified: Secondary | ICD-10-CM | POA: Diagnosis present

## 2015-05-03 DIAGNOSIS — E119 Type 2 diabetes mellitus without complications: Secondary | ICD-10-CM

## 2015-05-03 DIAGNOSIS — I10 Essential (primary) hypertension: Secondary | ICD-10-CM | POA: Diagnosis present

## 2015-05-03 DIAGNOSIS — Z85828 Personal history of other malignant neoplasm of skin: Secondary | ICD-10-CM

## 2015-05-03 DIAGNOSIS — I864 Gastric varices: Secondary | ICD-10-CM | POA: Diagnosis present

## 2015-05-03 DIAGNOSIS — Z8521 Personal history of malignant neoplasm of larynx: Secondary | ICD-10-CM

## 2015-05-03 DIAGNOSIS — Z85819 Personal history of malignant neoplasm of unspecified site of lip, oral cavity, and pharynx: Secondary | ICD-10-CM

## 2015-05-03 DIAGNOSIS — R17 Unspecified jaundice: Secondary | ICD-10-CM

## 2015-05-03 DIAGNOSIS — R161 Splenomegaly, not elsewhere classified: Secondary | ICD-10-CM | POA: Diagnosis present

## 2015-05-03 DIAGNOSIS — I5189 Other ill-defined heart diseases: Secondary | ICD-10-CM | POA: Diagnosis present

## 2015-05-03 DIAGNOSIS — I8500 Esophageal varices without bleeding: Secondary | ICD-10-CM | POA: Diagnosis present

## 2015-05-03 DIAGNOSIS — R1319 Other dysphagia: Secondary | ICD-10-CM | POA: Diagnosis not present

## 2015-05-03 DIAGNOSIS — I959 Hypotension, unspecified: Secondary | ICD-10-CM | POA: Diagnosis not present

## 2015-05-03 DIAGNOSIS — R1310 Dysphagia, unspecified: Secondary | ICD-10-CM | POA: Diagnosis present

## 2015-05-03 DIAGNOSIS — D696 Thrombocytopenia, unspecified: Secondary | ICD-10-CM | POA: Diagnosis present

## 2015-05-03 HISTORY — DX: Pulmonary hypertension, unspecified: I27.20

## 2015-05-03 HISTORY — DX: Gastric varices: I86.4

## 2015-05-03 HISTORY — DX: Esophageal varices without bleeding: I85.00

## 2015-05-03 HISTORY — DX: Malignant neoplasm of pharynx, unspecified: C14.0

## 2015-05-03 HISTORY — DX: Unspecified cirrhosis of liver: K74.60

## 2015-05-03 HISTORY — DX: Other ascites: R18.8

## 2015-05-03 HISTORY — DX: Other ill-defined heart diseases: I51.89

## 2015-05-03 LAB — CBC WITH DIFFERENTIAL/PLATELET
BASOS PCT: 1 %
Basophils Absolute: 0 10*3/uL (ref 0.0–0.1)
Eosinophils Absolute: 0.2 10*3/uL (ref 0.0–0.7)
Eosinophils Relative: 6 %
HCT: 25 % — ABNORMAL LOW (ref 39.0–52.0)
HEMOGLOBIN: 9.1 g/dL — AB (ref 13.0–17.0)
Lymphocytes Relative: 12 %
Lymphs Abs: 0.4 10*3/uL — ABNORMAL LOW (ref 0.7–4.0)
MCH: 44.2 pg — AB (ref 26.0–34.0)
MCHC: 36.4 g/dL — ABNORMAL HIGH (ref 30.0–36.0)
MCV: 121.4 fL — ABNORMAL HIGH (ref 78.0–100.0)
Monocytes Absolute: 0.9 10*3/uL (ref 0.1–1.0)
Monocytes Relative: 27 %
NEUTROS PCT: 54 %
Neutro Abs: 1.8 10*3/uL (ref 1.7–7.7)
PLATELETS: 58 10*3/uL — AB (ref 150–400)
RBC: 2.06 MIL/uL — AB (ref 4.22–5.81)
RDW: 18.9 % — ABNORMAL HIGH (ref 11.5–15.5)
SMEAR REVIEW: DECREASED
WBC: 3.3 10*3/uL — AB (ref 4.0–10.5)

## 2015-05-03 LAB — VITAMIN B12: VITAMIN B 12: 3716 pg/mL — AB (ref 180–914)

## 2015-05-03 LAB — COMPREHENSIVE METABOLIC PANEL
ALBUMIN: 2 g/dL — AB (ref 3.5–5.0)
ALK PHOS: 121 U/L (ref 38–126)
ALT: 77 U/L — AB (ref 17–63)
AST: 88 U/L — AB (ref 15–41)
Anion gap: 5 (ref 5–15)
BUN: 16 mg/dL (ref 6–20)
CALCIUM: 7.7 mg/dL — AB (ref 8.9–10.3)
CHLORIDE: 97 mmol/L — AB (ref 101–111)
CO2: 30 mmol/L (ref 22–32)
Creatinine, Ser: 0.56 mg/dL — ABNORMAL LOW (ref 0.61–1.24)
GFR calc non Af Amer: >60 mL/min (ref >60)
GLUCOSE: 166 mg/dL — AB (ref 65–99)
Potassium: 3.8 mmol/L (ref 3.5–5.1)
SODIUM: 132 mmol/L — AB (ref 135–145)
Total Bilirubin: 10.4 mg/dL — ABNORMAL HIGH (ref 0.3–1.2)
Total Protein: 5.5 g/dL — ABNORMAL LOW (ref 6.5–8.1)

## 2015-05-03 LAB — URINE MICROSCOPIC-ADD ON

## 2015-05-03 LAB — PROTIME-INR
INR: 2.58 — ABNORMAL HIGH (ref 0.00–1.49)
Prothrombin Time: 27.3 seconds — ABNORMAL HIGH (ref 11.6–15.2)

## 2015-05-03 LAB — URINALYSIS, ROUTINE W REFLEX MICROSCOPIC
GLUCOSE, UA: 100 mg/dL — AB
Leukocytes, UA: NEGATIVE
Nitrite: POSITIVE — AB
PH: 6.5 (ref 5.0–8.0)
Protein, ur: 30 mg/dL — AB
Specific Gravity, Urine: 1.025 (ref 1.005–1.030)
Urobilinogen, UA: >8 mg/dL — ABNORMAL HIGH (ref 0.0–1.0)

## 2015-05-03 LAB — ACETAMINOPHEN LEVEL: Acetaminophen (Tylenol), Serum: <10 ug/mL — ABNORMAL LOW (ref 10–30)

## 2015-05-03 LAB — GLUCOSE, CAPILLARY
GLUCOSE-CAPILLARY: 149 mg/dL — AB (ref 65–99)
GLUCOSE-CAPILLARY: 148 mg/dL — AB (ref 65–99)

## 2015-05-03 LAB — TROPONIN I

## 2015-05-03 LAB — BILIRUBIN, DIRECT: BILIRUBIN DIRECT: 4.8 mg/dL — AB (ref 0.1–0.5)

## 2015-05-03 LAB — BRAIN NATRIURETIC PEPTIDE: B Natriuretic Peptide: 229 pg/mL — ABNORMAL HIGH (ref 0.0–100.0)

## 2015-05-03 MED ORDER — INFLUENZA VAC SPLIT QUAD 0.5 ML IM SUSY
0.5000 mL | PREFILLED_SYRINGE | INTRAMUSCULAR | Status: AC
  Administered 2015-05-04: 0.5 mL via INTRAMUSCULAR
  Filled 2015-05-03: qty 0.5

## 2015-05-03 MED ORDER — IPRATROPIUM-ALBUTEROL 0.5-2.5 (3) MG/3ML IN SOLN: 3.0000 mL | Freq: Four times a day (QID) | RESPIRATORY_TRACT | Status: DC | PRN

## 2015-05-03 MED ORDER — SODIUM CHLORIDE 0.9 % IJ SOLN
3.0000 mL | INTRAMUSCULAR | Status: DC | PRN
  Administered 2015-05-05: 3 mL via INTRAVENOUS
  Filled 2015-05-03: qty 3

## 2015-05-03 MED ORDER — FOLIC ACID 1 MG PO TABS
1.0000 mg | ORAL_TABLET | Freq: Every day | ORAL | Status: DC
  Administered 2015-05-03 – 2015-05-04 (×2): 1 mg via ORAL
  Filled 2015-05-03 (×3): qty 1

## 2015-05-03 MED ORDER — CHLORHEXIDINE GLUCONATE 0.12 % MT SOLN
15.0000 mL | Freq: Two times a day (BID) | OROMUCOSAL | Status: DC
  Administered 2015-05-03 – 2015-05-07 (×6): 15 mL via OROMUCOSAL
  Filled 2015-05-03 (×8): qty 15

## 2015-05-03 MED ORDER — NYSTATIN 100000 UNIT/ML MT SUSP
5.0000 mL | Freq: Four times a day (QID) | OROMUCOSAL | Status: DC
  Administered 2015-05-03 – 2015-05-04 (×4): 500000 [IU] via ORAL
  Filled 2015-05-03 (×3): qty 5

## 2015-05-03 MED ORDER — SODIUM CHLORIDE 0.9 % IV SOLN
INTRAVENOUS | Status: DC
  Administered 2015-05-03: 13:00:00 via INTRAVENOUS

## 2015-05-03 MED ORDER — ALBUTEROL SULFATE (2.5 MG/3ML) 0.083% IN NEBU
2.5000 mg | INHALATION_SOLUTION | Freq: Four times a day (QID) | RESPIRATORY_TRACT | Status: DC | PRN
Start: 2015-05-03 — End: 2015-05-03

## 2015-05-03 MED ORDER — INSULIN ASPART 100 UNIT/ML ~~LOC~~ SOLN
0.0000 [IU] | Freq: Three times a day (TID) | SUBCUTANEOUS | Status: DC
  Administered 2015-05-04 (×2): 2 [IU] via SUBCUTANEOUS
  Administered 2015-05-05 (×3): 1 [IU] via SUBCUTANEOUS

## 2015-05-03 MED ORDER — MAGNESIUM OXIDE 400 (241.3 MG) MG PO TABS
400.0000 mg | ORAL_TABLET | Freq: Every day | ORAL | Status: DC
  Administered 2015-05-04: 400 mg via ORAL
  Filled 2015-05-03 (×3): qty 1

## 2015-05-03 MED ORDER — SPIRONOLACTONE 25 MG PO TABS
12.5000 mg | ORAL_TABLET | Freq: Every day | ORAL | Status: DC
  Filled 2015-05-03: qty 1

## 2015-05-03 MED ORDER — TORSEMIDE 20 MG PO TABS: 40.0000 mg | ORAL_TABLET | Freq: Two times a day (BID) | ORAL | Status: DC

## 2015-05-03 MED ORDER — SODIUM CHLORIDE 0.9 % IV SOLN
250.0000 mL | INTRAVENOUS | Status: DC | PRN
  Administered 2015-05-05: 250 mL via INTRAVENOUS

## 2015-05-03 MED ORDER — IOHEXOL 300 MG/ML  SOLN
100.0000 mL | Freq: Once | INTRAMUSCULAR | Status: AC | PRN
  Administered 2015-05-03: 100 mL via INTRAVENOUS

## 2015-05-03 MED ORDER — TAMSULOSIN HCL 0.4 MG PO CAPS
0.4000 mg | ORAL_CAPSULE | Freq: Every day | ORAL | Status: DC
  Administered 2015-05-03 – 2015-05-04 (×2): 0.4 mg via ORAL
  Filled 2015-05-03 (×3): qty 1

## 2015-05-03 MED ORDER — SODIUM CHLORIDE 0.9 % IJ SOLN
3.0000 mL | Freq: Two times a day (BID) | INTRAMUSCULAR | Status: DC
  Administered 2015-05-03 – 2015-05-07 (×3): 3 mL via INTRAVENOUS

## 2015-05-03 MED ORDER — DESVENLAFAXINE SUCCINATE ER 50 MG PO TB24
50.0000 mg | ORAL_TABLET | Freq: Every day | ORAL | Status: DC
  Administered 2015-05-03 – 2015-05-06 (×3): 50 mg via ORAL
  Filled 2015-05-03 (×7): qty 1

## 2015-05-03 MED ORDER — NITROGLYCERIN 0.4 MG SL SUBL
0.4000 mg | SUBLINGUAL_TABLET | SUBLINGUAL | Status: DC | PRN
  Administered 2015-05-05: 0.4 mg via SUBLINGUAL
  Filled 2015-05-03: qty 1

## 2015-05-03 MED ORDER — IOHEXOL 300 MG/ML  SOLN
50.0000 mL | Freq: Once | INTRAMUSCULAR | Status: AC | PRN
Start: 2015-05-03 — End: 2015-05-03
  Administered 2015-05-03: 50 mL via ORAL

## 2015-05-03 MED ORDER — PIOGLITAZONE HCL 15 MG PO TABS
30.0000 mg | ORAL_TABLET | Freq: Every day | ORAL | Status: DC
  Filled 2015-05-03: qty 1
  Filled 2015-05-03: qty 2
  Filled 2015-05-03: qty 1
  Filled 2015-05-03 (×2): qty 2

## 2015-05-03 MED ORDER — PHYTONADIONE 5 MG PO TABS
5.0000 mg | ORAL_TABLET | Freq: Once | ORAL | Status: AC
  Administered 2015-05-03: 5 mg via ORAL
  Filled 2015-05-03: qty 1

## 2015-05-03 MED ORDER — ONDANSETRON HCL 4 MG/2ML IJ SOLN
4.0000 mg | Freq: Four times a day (QID) | INTRAMUSCULAR | Status: DC | PRN
  Administered 2015-05-04: 4 mg via INTRAVENOUS
  Filled 2015-05-03: qty 2

## 2015-05-03 MED ORDER — FLUOXETINE HCL 20 MG PO CAPS
20.0000 mg | ORAL_CAPSULE | Freq: Every day | ORAL | Status: DC
  Filled 2015-05-03: qty 1

## 2015-05-03 MED ORDER — ALBUTEROL SULFATE HFA 108 (90 BASE) MCG/ACT IN AERS: 2.0000 | INHALATION_SPRAY | Freq: Four times a day (QID) | RESPIRATORY_TRACT | Status: DC | PRN

## 2015-05-03 MED ORDER — DOCUSATE SODIUM 100 MG PO CAPS
100.0000 mg | ORAL_CAPSULE | Freq: Every day | ORAL | Status: DC
  Administered 2015-05-03 – 2015-05-04 (×2): 100 mg via ORAL
  Filled 2015-05-03 (×3): qty 1

## 2015-05-03 MED ORDER — PANTOPRAZOLE SODIUM 40 MG PO TBEC
80.0000 mg | DELAYED_RELEASE_TABLET | Freq: Every day | ORAL | Status: DC
  Administered 2015-05-03 – 2015-05-04 (×2): 80 mg via ORAL
  Filled 2015-05-03 (×2): qty 2

## 2015-05-03 MED ORDER — METOPROLOL TARTRATE 25 MG PO TABS
12.5000 mg | ORAL_TABLET | Freq: Two times a day (BID) | ORAL | Status: DC
  Administered 2015-05-03 – 2015-05-04 (×4): 12.5 mg via ORAL
  Filled 2015-05-03 (×5): qty 1

## 2015-05-03 MED ORDER — MERCAPTOPURINE 50 MG PO TABS: 50.0000 mg | ORAL_TABLET | Freq: Every day | ORAL | Status: DC

## 2015-05-03 MED ORDER — ONDANSETRON HCL 4 MG PO TABS
4.0000 mg | ORAL_TABLET | Freq: Four times a day (QID) | ORAL | Status: DC | PRN
  Administered 2015-05-03: 4 mg via ORAL
  Filled 2015-05-03: qty 1

## 2015-05-03 MED ORDER — MAGIC MOUTHWASH
15.0000 mL | Freq: Four times a day (QID) | ORAL | Status: DC | PRN
  Administered 2015-05-03 – 2015-05-04 (×5): 15 mL via ORAL
  Filled 2015-05-03 (×5): qty 15

## 2015-05-03 MED ORDER — FLUTICASONE PROPIONATE 50 MCG/ACT NA SUSP
2.0000 | Freq: Every day | NASAL | Status: DC
  Administered 2015-05-03 – 2015-05-07 (×5): 2 via NASAL
  Filled 2015-05-03 (×2): qty 16

## 2015-05-03 MED ORDER — CETYLPYRIDINIUM CHLORIDE 0.05 % MT LIQD
7.0000 mL | Freq: Two times a day (BID) | OROMUCOSAL | Status: DC
  Administered 2015-05-03 – 2015-05-07 (×9): 7 mL via OROMUCOSAL

## 2015-05-03 MED ORDER — VITAMIN B-12 1000 MCG PO TABS
1000.0000 ug | ORAL_TABLET | Freq: Every day | ORAL | Status: DC
  Administered 2015-05-04: 1000 ug via ORAL
  Filled 2015-05-03 (×3): qty 1

## 2015-05-03 NOTE — Consult Note (Signed)
Referring Provider: Mikki Harbor* Primary Care Physician:  Delphina Cahill, MD Primary Gastroenterologist:  Dr. West Carbo prior to his retirement  Reason for Consultation:  Progressive cirrhosis  HPI: Antonio Stevens is a 54 y.o. male admitted with progressive weakness, difficulty eating, jaundice. Patient has h/o UC, cirrhosis, throat cancer (s/p XRT one year ago followed at [facility], did not tolerate chemo due to "low blood count").   Last week he noted urine turned dark. Saw Dr. [place] Crane last Thursday. Did blood work, I don't have those results. Has had increasing difficulty eating due to loss of appetite and throat pain. Reports some esophageal dysphagia. In the last 24 hours, nose bleeds really bad. About 3am this morning couldn't take anymore. Fatigue. Couldn't get out of bed. Jaundice. No etoh since 2007. More frequent abdominal pain, around umbilicus. Worse the last few days. Some vomiting, not sure if blood in emesis due to nosebleeds. Still a lot of heartburn at times on Nexium. Sore throat, Magic Mouthwash. Rare BM in past week. Stool softener usually help. No brbpr, melena. No recent medication changes except Flomax this week.   Known about cirrhosis about 10 years since his cholecystectomy. Presumed related to etoh. He is unable to provide details of previous work up. Quit etoh 2007.  In the ED hemoglobin 9.1, platelets 58,000. Sodium 132, creatinine 0.56, total bilirubin 10.4, AST 88, ALT 77, albumen 2. INR 2.58. CT abdomen pelvis as outlined below.  Reviewed records from [place]. Labs from May 2016. Albumen 2.5, total bilirubin 1.6, alkaline phosphatase 98, AST 31, ALT 29. Hemoglobin 9.2, platelets 129,000, iron 87, ferritin 356, TIBC 191, iron saturations 46%, sedimentation rate 62.   EGD with PEG placement in 09/2013 but I could not find report.   Prior to Admission medications   Medication Sig Start Date End Date Taking? Authorizing Provider  albuterol (PROVENTIL HFA;VENTOLIN HFA)  108 (90 BASE) MCG/ACT inhaler Inhale 2 puffs into the lungs every 6 (six) hours as needed for wheezing.   Yes Historical Provider, MD  aspirin EC 81 MG tablet Take 81 mg by mouth daily.   Yes Historical Provider, MD  atorvastatin (LIPITOR) 40 MG tablet Take 40 mg by mouth daily.   Yes Historical Provider, MD  clopidogrel (PLAVIX) 75 MG tablet Take 75 mg by mouth daily.   Yes Historical Provider, MD  desvenlafaxine (PRISTIQ) 50 MG 24 hr tablet Take 50 mg by mouth daily.   Yes Historical Provider, MD  docusate sodium (COLACE) 100 MG capsule Take 100 mg by mouth daily.   Yes Historical Provider, MD  esomeprazole (NEXIUM) 40 MG capsule Take 40 mg by mouth daily before breakfast.   Yes Historical Provider, MD  FLUoxetine (PROZAC) 20 MG capsule Take 20 mg by mouth daily.   Yes Historical Provider, MD  fluticasone (FLONASE) 50 MCG/ACT nasal spray Place 2 sprays into the nose 2 (two) times daily as needed for rhinitis.   Yes Historical Provider, MD  folic acid (FOLVITE) 1 MG tablet Take 1 mg by mouth daily.   Yes Historical Provider, MD  magnesium oxide (MAG-OX) 400 MG tablet Take 400 mg by mouth daily.   Yes Historical Provider, MD  mercaptopurine (PURINETHOL) 50 MG tablet Take 50 mg by mouth daily. 2 pills daily. Give on an empty stomach 1 hour before or 2 hours after meals. Caution: Chemotherapy.   Yes Historical Provider, MD  metoprolol tartrate (LOPRESSOR) 25 MG tablet Take 25 mg by mouth 2 (two) times daily.   Yes Historical Provider,  MD  nitroGLYCERIN (NITROSTAT) 0.4 MG SL tablet Place 0.4 mg under the tongue every 5 (five) minutes as needed for chest pain.   Yes Historical Provider, MD  potassium chloride (K-DUR) 10 MEQ tablet Take 20 mEq by mouth daily.   Yes Historical Provider, MD  spironolactone (ALDACTONE) 25 MG tablet Take 12.5 mg by mouth daily. Take one half 25 mg tablet daily   Yes Historical Provider, MD  tamsulosin (FLOMAX) 0.4 MG CAPS capsule Take 0.4 mg by mouth.   Yes Historical  Provider, MD  torsemide (DEMADEX) 20 MG tablet Take 40 mg by mouth 2 (two) times daily. Takes 2 tablets in the morning and 1 tablet in the evening   Yes Historical Provider, MD  vitamin B-12 (CYANOCOBALAMIN) 1000 MCG tablet Take 1,000 mcg by mouth daily.   Yes Historical Provider, MD  carvedilol (COREG) 3.125 MG tablet Take 3.125 mg by mouth 2 (two) times daily with a meal.    Historical Provider, MD  pioglitazone (ACTOS) 30 MG tablet Take 30 mg by mouth daily.    Historical Provider, MD  predniSONE (DELTASONE) 20 MG tablet Take 20 mg by mouth as needed.    Historical Provider, MD  tadalafil (CIALIS) 5 MG tablet Take 5 mg by mouth as needed for erectile dysfunction.    Historical Provider, MD  vardenafil (LEVITRA) 20 MG tablet Take 20 mg by mouth as needed for erectile dysfunction.    Historical Provider, MD    Current Facility-Administered Medications  Medication Dose Route Frequency Provider Last Rate Last Dose  . magic mouthwash  15 mL Oral QID PRN Merryl Hacker, MD   15 mL at 05/04/2015 0741    Allergies as of 05/05/2015  . (No Known Allergies)    Past Medical History  Diagnosis Date  . CHF (congestive heart failure)   . CAD (coronary artery disease)   . Hypertension   . Anemia   . Hyperlipidemia   . Sleep apnea     does not use CPAP  . Obesity   . Diabetes mellitus without complication     patient denies since weight loss from throat cancer treatment  . Ulcerative colitis     Diagnosed in late 51s. Dr. West Carbo before retired, last seen in 2013  . Dyspnea   . Cough   . Back pain   . COPD (chronic obstructive pulmonary disease)   . Erectile dysfunction   . Skin cancer   . Anemia of other chronic disease 07/28/2013  . Throat cancer     XRT, followed at [facility]    Past Surgical History  Procedure Laterality Date  . Cholecystectomy  2007  . Coronary artery bypass graft  1995  . Angioplasty    . Coronary stent placement  07/2014  . Rotator cuff repair    .  Tonsillectomy    . Nasal septum surgery    . Colonoscopy      Dr. West Carbo, after age 23  . Esophagogastroduodenoscopy      Dr. West Carbo    Family History  Problem Relation Age of Onset  . Heart attack Father   . Diabetes Brother   . Heart attack Brother   . Emphysema Maternal Grandfather   . Colon cancer Neg Hx   . Liver disease Other     "I'm sure it does", lot of etoh abuse in family    Social History   Social History  . Marital Status: Divorced    Spouse Name: N/A  . Number  of Children: 0  . Years of Education: N/A   Occupational History  . Not on file.   Social History Main Topics  . Smoking status: Former Smoker -- 1.00 packs/day    Types: Cigarettes  . Smokeless tobacco: Never Used  . Alcohol Use: No     Comment: last etoh 2007. heavy before.   . Drug Use: No  . Sexual Activity: Not on file   Other Topics Concern  . Not on file   Social History Narrative     ROS:  General: 100 pound weight loss with throat cancer, stabilized somewhat. +for anorexia. No fever, chills. + fatigue, weakness. Eyes: Negative for vision changes.  ENT: + for hoarseness, difficulty swallowing , painful throat. +nosebleeds.  CV: Negative for chest pain, angina, palpitations, dyspnea on exertion.No peripheral edema.  Respiratory: Negative for dyspnea at rest, dyspnea on exertion, cough, sputum, wheezing.  GI: See history of present illness. GU:  Negative for dysuria, hematuria, urinary incontinence, urinary frequency, nocturnal urination.  MS: Negative for joint pain, low back pain.  Derm: Negative for rash or itching.  Neuro: Negative for weakness, abnormal sensation, seizure, frequent headaches, memory loss, confusion.  Psych: Negative for anxiety, depression, suicidal ideation, hallucinations.  Endo: see general.  Heme: Negative for bruising or bleeding. Allergy: Negative for rash or hives.       Physical Examination: Vital signs in last 24 hours: Temp:  [98.1 F  (36.7 C)] 98.1 F (36.7 C) (09/15 0832) Pulse Rate:  [80-85] 81 (09/15 0832) Resp:  [12-16] 16 (09/15 0832) BP: (91-114)/(45-85) 112/57 mmHg (09/15 0832) SpO2:  [90 %-100 %] 95 % (09/15 0800) Weight:  [232 lb (105.235 kg)] 232 lb (105.235 kg) (09/15 2297)    General: Well-nourished, well-developed in no acute distress. Accompanied by sister and "son".  Head: Normocephalic, atraumatic.   Eyes: Conjunctiva pink, + icterus. Mouth: Oropharyngeal mucosa moist and pink , no lesions erythema or exudate. Neck: Supple without thyromegaly, masses, or lymphadenopathy.  Lungs: Clear to auscultation bilaterally.  Heart: Regular rate and rhythm, no rubs or gallops. +SEM Abdomen: Bowel sounds are normal, obese but soft. nontender, no hepatosplenomegaly appreciated on exam but limited due to body habitus. No abdominal bruits or    hernia , no rebound or guarding.   Rectal: not performed Extremities: No lower extremity edema, clubbing, deformity.  Neuro: Alert and oriented x 4 , grossly normal neurologically.  Skin: Warm and dry, no rash or jaundice.   Psych: Alert and cooperative, normal mood and affect.        Intake/Output from previous day:   Intake/Output this shift:    Lab Results: CBC  Recent Labs  04/26/2015 0430  WBC 3.3*  HGB 9.1*  HCT 25.0*  MCV 121.4*  PLT 58*   BMET  Recent Labs  04/22/2015 0430  NA 132*  K 3.8  CL 97*  CO2 30  GLUCOSE 166*  BUN 16  CREATININE 0.56*  CALCIUM 7.7*   LFT  Recent Labs  05/02/2015 0430  BILITOT 10.4*  BILIDIR 4.8*  ALKPHOS 121  AST 88*  ALT 77*  PROT 5.5*  ALBUMIN 2.0*    Lipase No results for input(s): LIPASE in the last 72 hours.  PT/INR  Recent Labs  05/14/2015 0430  LABPROT 27.3*  INR 2.58*      Imaging Studies: Ct Abdomen Pelvis W Contrast  05/10/2015   CLINICAL DATA:  New onset jaundice.  Weakness.  EXAM: CT ABDOMEN AND PELVIS WITH CONTRAST  TECHNIQUE: Multidetector  CT imaging of the abdomen and pelvis was  performed using the standard protocol following bolus administration of intravenous contrast.  CONTRAST:  37mL OMNIPAQUE IOHEXOL 300 MG/ML SOLN, 137mL OMNIPAQUE IOHEXOL 300 MG/ML SOLN  COMPARISON:  Radiographs earlier this day.  CT 10/06/2012  FINDINGS: Lower chest: Atelectasis in the right middle lobe. Dependent atelectasis in the left lower lobe with trace left pleural effusion.  Liver: Nodular contours consistent with cirrhosis. Increased nodularity compared to prior exam. No focal lesion.  Hepatobiliary: Postcholecystectomy with clips in the gallbladder fossa. No biliary dilatation. Portal vein appears patent.  Pancreas: No ductal dilatation.  No evidence of focal lesion.  Spleen: Prominent in size. Maximal dimension 13 cm, splenic volume of 566 mL.  Adrenal glands: No nodule.  Mild left adrenal thickening.  Kidneys: Symmetric renal enhancement. No hydronephrosis. Calcifications in the right renal hila, likely combination of vascular and nonobstructing stone. No focal renal lesion.  Stomach/Bowel: Mild gastric wall thickening about the greater curvature. Multiple perigastric and paraesophageal varices. Mild small bowel wall thickening involving the upper abdominal large bowel loops. The cecum and ascending colonic thickening may reflect portal enteropathy. Moderate volume of stool throughout the colon without colonic wall thickening. The appendix is normal.  Vascular/Lymphatic: Multiple perigastric and paraesophageal varices. Multiple varices in the left upper quadrant of the abdomen. Splenorenal shunting noted. There is recannulization of the umbilical vein. No retroperitoneal adenopathy. Abdominal aorta is normal in caliber. Atherosclerosis of the aorta without aneurysm.  Reproductive: Prostate gland is normal in size.  Bladder: Minimally distended.  Other: Moderate volume of intra-abdominal and pelvic ascites. No free air. No loculated fluid collection or abscess. Mild haziness to the mesenteric consistent  with third-spacing. Mild whole body wall edema. There is fat in both inguinal canals.  Musculoskeletal: There are no acute or suspicious osseous abnormalities. Degenerative change in the spine.  IMPRESSION: 1. Progressive cirrhosis. Increased perigastric and paraesophageal, left upper quadrant varices with portosystemic shunting and moderate intra-abdominal ascites. No focal hepatic lesion. 2. Gastric, small bowel, and ascending colonic wall thickening. Findings suggest portal enteropathy. 3. Mild splenomegaly.   Electronically Signed   By: Jeb Levering M.D.   On: 05/05/2015 06:51   Dg Abd Acute W/chest  05/02/2015   CLINICAL DATA:  Weakness and shortness of breath.  EXAM: DG ABDOMEN ACUTE W/ 1V CHEST  COMPARISON:  CT 10/06/2012  FINDINGS: Patient is post median sternotomy. The heart is enlarged. There is vascular congestion. Probable left basilar atelectasis. No large pleural effusion.  No dilated bowel loops to suggest obstruction. No free intra-abdominal air. Hazy opacity throughout the abdomen can be seen with intra-abdominal ascites. Surgical clips in the right upper quadrant from prior cholecystectomy. No acute osseous abnormalities.  IMPRESSION: 1. Nonobstructive bowel gas pattern. Hazy opacity throughout the abdomen, can be seen with ascites. 2. Cardiomegaly with vascular congestion.   Electronically Signed   By: Jeb Levering M.D.   On: 05/06/2015 05:25  [4 week]   Impression: 54 year old gentleman with history of throat cancer status post XRT one year ago complicated by massive weight loss, requirement of PEG placement (09/2013) for severe odynophagia, subsequently removed, who presents with 2 week history of progressive weakness, anorexia, new jaundice. Gives history of known cirrhosis since 2007, stopped drinking at that time. Extent of previous workup unclear, patient previously followed by Dr. West Carbo. Also with history of UC, well-controlled with 6-MP. Previously failed "injection".  Tried several oral medications as well. Last colonoscopy several years ago.  CT documents varices, specifically,  perigastric and periesophageal. I could not locate last EGD reports done at [facility]. Patient reports recent vomiting in setting of nosebleeds. With some blood in emesis but felt to be related to nosebleeds. No melena.    Patient now with decompensated cirrhosis of unclear etiology.   Patient with throat pain with swallowing. Has had intermittent [employer] Magic Mouthwash and Diflucan. At increased risk of candida given immunosuppressed state.   Plan: 1. Give dose of vitamin K today.  2. Recheck LFTs, PT/INR, CBC in AM.  3. Gentle hydration if patient unable to tolerate POs.  4. Consider outpatient EGD and colonoscopy.  5. Check thiopurine metabolites given risk of hepatotoxicity.  6. Nystatin swish and swallow.   We would like to thank you for the opportunity to participate in the care of Antonio Stevens.  Laureen Ochs. Bernarda Caffey [facility] [phone] 9/15/201612:17 PM     LOS: 0 days  Attending note:  Patient seen and examined this evening. Agree with above assessment and recommendations. Etiology of cholestasis not clear. At this time, would consider new or worsening right heart failure as a potential cause. Would consider an echocardiogram. Drug toxicity related to 6-MP metabolites possible but seems less likely as he has done well with a stable long-term dose with no apparent new medications to produce a potential drug interaction. Patient to have an MRI/MRCP to further evaluate for an occult hepatoma (or even a cholangiocarcinoma given coexisting ulcerative colitis) which could be complicating the clinical picture. Further recommendations to follow.

## 2015-05-03 NOTE — H&P (Signed)
[facility] History and Physical  DURREL MCNEE LGX:[id number] DOB: [birth year] DOA: 05/06/2015  Referring physician:  PCP: Delphina Cahill, MD   Chief Complaint: generalized weakness.   HPI: Antonio Stevens is a very pleasant 54 y.o. 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 Initial evaluation in the emergency department reveals jaundiced patient with hypotension thrombocytopenia elevated INR. Patient states for the last 2 weeks he's experienced progressive worsening generalized weakness associated with poor appetite. Yesterday he developed epistaxis nausea and vomiting with 2 episodes of emesis. He is unsure if coffee ground emesis due to his nosebleeds. In addition he went to see his primary care provider due to darkening malodorous urine. His primary care provider sent him to the emergency department for evaluation. He denies chest pain palpitations fever chills. He denies abdominal pain or flank pain, but doesn't endorse a loading sensation. He denies dysuria hematuria frequency or urgency. In addition he has experienced worsening sore throat that he relates to radiation. Workup in the emergency department significant for hemoglobin of 9.1 platelets 58, MCV 121, sodium 132 INR 2.58 serum glucose 166. AST 88 ALT 77 total bilirubin 10.4, BNP 229 Initial troponin negative. CT of the abdomen reveals progressive cirrhosis, intra-abdominal ascites with paraesophageal varices. Nominal with chest x-ray reveals cardiomegaly with vascular congestion.  In the emergency department he is afebrile hemodynamically stable and not hypoxic.  Review of Systems:  Constitutional:  10 point review of systems complete and all systems are negative except as indicated  in the history of present illness  Past Medical History  Diagnosis Date  . CHF (congestive heart failure)   . CAD (coronary artery disease)   . Hypertension   . Anemia   . Hyperlipidemia   . Sleep apnea     does not use CPAP  . Obesity   . Diabetes mellitus without complication     patient denies since weight loss from throat cancer treatment  . Ulcerative colitis     Diagnosed in late 46s. Dr. West Carbo before retired, last seen in 2013  . Dyspnea   . Cough   . Back pain   . COPD (chronic obstructive pulmonary disease)   . Erectile dysfunction   . Skin cancer   . Anemia of other chronic disease 07/28/2013  . Throat cancer     XRT, followed at [facility]  . Ascites   . Gastric varices   . Varices, esophageal   . Cirrhosis    Past Surgical History  Procedure Laterality Date  . Cholecystectomy  2007  . Coronary artery bypass graft  1995  . Angioplasty    . Coronary stent placement  07/2014  . Rotator cuff repair    . Tonsillectomy    . Nasal septum surgery    . Colonoscopy      Dr. West Carbo, after age 50  . Esophagogastroduodenoscopy      Dr. West Carbo  . Knee arthroscopy     Social History:  reports that he has quit smoking. His smoking use included Cigarettes. He smoked 1.00 pack per day. He has never used smokeless tobacco. He reports that he does not drink alcohol or use illicit drugs.  No Known Allergies  Family History  Problem Relation Age of Onset  . Heart attack Father   . Diabetes Brother   .  Heart attack Brother   . Emphysema Maternal Grandfather   . Colon cancer Neg Hx   . Liver disease Other     "I'm sure it does", lot of etoh abuse in family     Prior to Admission medications   Medication Sig Start Date End Date Taking? Authorizing Provider  albuterol (PROVENTIL HFA;VENTOLIN HFA) 108 (90 BASE) MCG/ACT inhaler Inhale 2 puffs into the lungs every 6 (six) hours as needed for wheezing.   Yes Historical Provider, MD  aspirin EC 81 MG tablet Take 81 mg  by mouth daily.   Yes Historical Provider, MD  atorvastatin (LIPITOR) 40 MG tablet Take 40 mg by mouth daily.   Yes Historical Provider, MD  clopidogrel (PLAVIX) 75 MG tablet Take 75 mg by mouth daily.   Yes Historical Provider, MD  desvenlafaxine (PRISTIQ) 50 MG 24 hr tablet Take 50 mg by mouth daily.   Yes Historical Provider, MD  docusate sodium (COLACE) 100 MG capsule Take 100 mg by mouth daily.   Yes Historical Provider, MD  esomeprazole (NEXIUM) 40 MG capsule Take 40 mg by mouth daily before breakfast.   Yes Historical Provider, MD  FLUoxetine (PROZAC) 20 MG capsule Take 20 mg by mouth daily.   Yes Historical Provider, MD  fluticasone (FLONASE) 50 MCG/ACT nasal spray Place 2 sprays into the nose 2 (two) times daily as needed for rhinitis.   Yes Historical Provider, MD  folic acid (FOLVITE) 1 MG tablet Take 1 mg by mouth daily.   Yes Historical Provider, MD  magnesium oxide (MAG-OX) 400 MG tablet Take 400 mg by mouth daily.   Yes Historical Provider, MD  mercaptopurine (PURINETHOL) 50 MG tablet Take 50 mg by mouth daily. 2 pills daily. Give on an empty stomach 1 hour before or 2 hours after meals. Caution: Chemotherapy.   Yes Historical Provider, MD  metoprolol tartrate (LOPRESSOR) 25 MG tablet Take 25 mg by mouth 2 (two) times daily.   Yes Historical Provider, MD  potassium chloride (K-DUR) 10 MEQ tablet Take 20 mEq by mouth daily.   Yes Historical Provider, MD  tamsulosin (FLOMAX) 0.4 MG CAPS capsule Take 0.4 mg by mouth.   Yes Historical Provider, MD  vitamin B-12 (CYANOCOBALAMIN) 1000 MCG tablet Take 1,000 mcg by mouth daily.   Yes Historical Provider, MD  carvedilol (COREG) 3.125 MG tablet Take 3.125 mg by mouth 2 (two) times daily with a meal.    Historical Provider, MD  nitroGLYCERIN (NITROSTAT) 0.4 MG SL tablet Place 0.4 mg under the tongue every 5 (five) minutes as needed for chest pain.    Historical Provider, MD  pioglitazone (ACTOS) 30 MG tablet Take 30 mg by mouth daily.     Historical Provider, MD  predniSONE (DELTASONE) 20 MG tablet Take 20 mg by mouth as needed.    Historical Provider, MD  spironolactone (ALDACTONE) 25 MG tablet Take 12.5 mg by mouth daily. Take one half 25 mg tablet daily    Historical Provider, MD  tadalafil (CIALIS) 5 MG tablet Take 5 mg by mouth as needed for erectile dysfunction.    Historical Provider, MD  torsemide (DEMADEX) 20 MG tablet Take 40 mg by mouth 2 (two) times daily. Takes 2 tablets in the morning and 1 tablet in the evening    Historical Provider, MD  vardenafil (LEVITRA) 20 MG tablet Take 20 mg by mouth as needed for erectile dysfunction.    Historical Provider, MD   Physical Exam: Filed Vitals:   04/21/2015 0730 05/12/2015 0745  04/28/2015 0800 04/20/2015 0832  BP: 91/74 114/67 107/62 112/57  Pulse: 85 85 81 81  Temp:    98.1 F (36.7 C)  TempSrc:      Resp: 14 16 15 16   Height:    6' (1.829 m)  Weight:    105.235 kg (232 lb)  SpO2: 92% 97% 95%     Wt Readings from Last 3 Encounters:  05/05/2015 105.235 kg (232 lb)  04/29/13 119.381 kg (263 lb 3 oz)  04/15/13 128.685 kg (283 lb 11.2 oz)    General:  Appears calm and comfortable, chronically ill Eyes: PERRL, normal lids, scleral icterus, ENT: grossly normal hearing, mucous membranes of his mouth are moist and pink tongue ruby red no exudate  Neck: no LAD, masses or thyromegaly Cardiovascular: RRR, +murmur. Trace -1+ LE edema. Respiratory: CTA bilaterally, no w/r/r. Normal respiratory effort. Abdomen: soft, ntnd + BS. No wave Skin: no rash or induration seen on limited exam: somewhat jaundice Musculoskeletal: grossly normal tone BUE/BLE Psychiatric: grossly normal mood and affect, speech fluent and appropriate Neurologic: grossly non-focal.          Labs on Admission:  Basic Metabolic Panel:  Recent Labs Lab 04/30/2015 0430  NA 132*  K 3.8  CL 97*  CO2 30  GLUCOSE 166*  BUN 16  CREATININE 0.56*  CALCIUM 7.7*   Liver Function Tests:  Recent Labs Lab  04/30/2015 0430  AST 88*  ALT 77*  ALKPHOS 121  BILITOT 10.4*  PROT 5.5*  ALBUMIN 2.0*   No results for input(s): LIPASE, AMYLASE in the last 168 hours. No results for input(s): AMMONIA in the last 168 hours. CBC:  Recent Labs Lab 05/14/2015 0430  WBC 3.3*  NEUTROABS 1.8  HGB 9.1*  HCT 25.0*  MCV 121.4*  PLT 58*   Cardiac Enzymes:  Recent Labs Lab 05/14/2015 0430  TROPONINI <0.03    BNP (last 3 results)  Recent Labs  05/11/2015 0430  BNP 229.0*    ProBNP (last 3 results) No results for input(s): PROBNP in the last 8760 hours.  CBG: No results for input(s): GLUCAP in the last 168 hours.  Radiological Exams on Admission: Ct Abdomen Pelvis W Contrast  05/12/2015   CLINICAL DATA:  New onset jaundice.  Weakness.  EXAM: CT ABDOMEN AND PELVIS WITH CONTRAST  TECHNIQUE: Multidetector CT imaging of the abdomen and pelvis was performed using the standard protocol following bolus administration of intravenous contrast.  CONTRAST:  11mL OMNIPAQUE IOHEXOL 300 MG/ML SOLN, 128mL OMNIPAQUE IOHEXOL 300 MG/ML SOLN  COMPARISON:  Radiographs earlier this day.  CT 10/06/2012  FINDINGS: Lower chest: Atelectasis in the right middle lobe. Dependent atelectasis in the left lower lobe with trace left pleural effusion.  Liver: Nodular contours consistent with cirrhosis. Increased nodularity compared to prior exam. No focal lesion.  Hepatobiliary: Postcholecystectomy with clips in the gallbladder fossa. No biliary dilatation. Portal vein appears patent.  Pancreas: No ductal dilatation.  No evidence of focal lesion.  Spleen: Prominent in size. Maximal dimension 13 cm, splenic volume of 566 mL.  Adrenal glands: No nodule.  Mild left adrenal thickening.  Kidneys: Symmetric renal enhancement. No hydronephrosis. Calcifications in the right renal hila, likely combination of vascular and nonobstructing stone. No focal renal lesion.  Stomach/Bowel: Mild gastric wall thickening about the greater curvature. Multiple  perigastric and paraesophageal varices. Mild small bowel wall thickening involving the upper abdominal large bowel loops. The cecum and ascending colonic thickening may reflect portal enteropathy. Moderate volume of stool throughout the  colon without colonic wall thickening. The appendix is normal.  Vascular/Lymphatic: Multiple perigastric and paraesophageal varices. Multiple varices in the left upper quadrant of the abdomen. Splenorenal shunting noted. There is recannulization of the umbilical vein. No retroperitoneal adenopathy. Abdominal aorta is normal in caliber. Atherosclerosis of the aorta without aneurysm.  Reproductive: Prostate gland is normal in size.  Bladder: Minimally distended.  Other: Moderate volume of intra-abdominal and pelvic ascites. No free air. No loculated fluid collection or abscess. Mild haziness to the mesenteric consistent with third-spacing. Mild whole body wall edema. There is fat in both inguinal canals.  Musculoskeletal: There are no acute or suspicious osseous abnormalities. Degenerative change in the spine.  IMPRESSION: 1. Progressive cirrhosis. Increased perigastric and paraesophageal, left upper quadrant varices with portosystemic shunting and moderate intra-abdominal ascites. No focal hepatic lesion. 2. Gastric, small bowel, and ascending colonic wall thickening. Findings suggest portal enteropathy. 3. Mild splenomegaly.   Electronically Signed   By: Jeb Levering M.D.   On: 05/06/2015 06:51   Dg Abd Acute W/chest  05/14/2015   CLINICAL DATA:  Weakness and shortness of breath.  EXAM: DG ABDOMEN ACUTE W/ 1V CHEST  COMPARISON:  CT 10/06/2012  FINDINGS: Patient is post median sternotomy. The heart is enlarged. There is vascular congestion. Probable left basilar atelectasis. No large pleural effusion.  No dilated bowel loops to suggest obstruction. No free intra-abdominal air. Hazy opacity throughout the abdomen can be seen with intra-abdominal ascites. Surgical clips in the  right upper quadrant from prior cholecystectomy. No acute osseous abnormalities.  IMPRESSION: 1. Nonobstructive bowel gas pattern. Hazy opacity throughout the abdomen, can be seen with ascites. 2. Cardiomegaly with vascular congestion.   Electronically Signed   By: Jeb Levering M.D.   On: 04/27/2015 05:25    EKG: Independently reviewed. SR  Assessment/Plan Principal Problem: Generalized weakness: likely related to worsening cirrhosis in patient with hx throat cancer s/p radiation and thrush with decreased oral intake and anemia. Will admit and request GI consult as well as physical therapy.    decompensated cirrhosis/ Liver failure: per CT as well as lab work and clinical presentation.  Patient reports diagnosis of cirrhosis 10 years ago when he had cholecystectomy. Chart review indicates lab work done in May of this year at [facility] with AST, alkaline phosphatase and ALT within the limits of normal, total bili 1.6. In addition ultrasound of the spleen at that time yields borderline splenomegaly. Defer further work up to GI  Active Problems:  Thrombocytopenia/elevated INR: related to #2. Chart review indicates platelet 129 37f/2016. Vitamin K per GI.   Varices, esophageal/  Varices, gastric: Home medications include Lopressor and i will include this. No reports hematemesis. Hemodynamically stable. BP stable on low end of normal. Defer to GI recommendation      Ascites: per CT. No wave on exam. Does have sob with lying flat. monitor    CAD (coronary artery disease): no chest pain. EKG with NSR    Hypertension: on low end of normal. Continue BBB     Diabetes mellitus without complication: diet controlled. Will obtain Hg A1c. Serum glucose 166 on admission. Will use SSI for optimal control     Ulcerative colitis: stable    COPD (chronic obstructive pulmonary disease): stable at baseline. Continue prn nebs    Throat cancer: s/p radiation. Reports being in "remission"           Code  Status: full DVT Prophylaxis: Family Communication: son at bedside Disposition Plan: home hopefully tomorrow  Time spent: 65 minutes  [facility] Pager [phone]

## 2015-05-03 NOTE — ED Notes (Signed)
Pt c/o weakness, nosebleeds and poor po intake.

## 2015-05-03 NOTE — Progress Notes (Signed)
PT Cancellation Note  Patient Details Name: Antonio Stevens MRN: [id number] DOB: [birth year]   Cancelled Treatment:    Reason Eval/Treat Not Completed: Patient not medically ready.  Pt just admitted, nursing is in the process of hooking him up to fluids, etc.  Will defer eval until tomorrow when hopefully pt is feeling a bit better.   [name] L   PT  04/20/2015, 1:10 PM [phone]

## 2015-05-03 NOTE — ED Notes (Signed)
Awaiting Magic Mouthwash from pharmacy.  Pt made aware.

## 2015-05-03 NOTE — ED Provider Notes (Signed)
CSN: [id number]     Arrival date & time 05/17/2015  0419 History   First MD Initiated Contact with Patient 04/28/2015 0424     Chief Complaint  Patient presents with  . Weakness     (Consider location/radiation/quality/duration/timing/severity/associated sxs/prior Treatment) HPI  This a 54 year old male with a history of CHF, coronary artery disease, hypertension, hyperlipidemia, diabetes, ulcerative colitis, and cancer of the larynx who presents with weakness, nosebleeds and poor by mouth intake. Patient reports one week history of worsening fatigue and poor by mouth intake. He states he saw his primary physician after he noted dark urine and thought he might be passing blood. Denies any flank pain or abdominal pain. Patient does state that he has felt bloated. He also reports at times short of breath. Shortness of breath worse with laying flat. No exertional component. Denies any chest pain to me but reported chest pain to nursing.  Patient also reports 2 to three-day history of worsening sore throat. States that he normally takes a mouthwash for recurrent throat issues but has been out for several days. Denies any fevers or coughs.  Of note, patient treated primarily at [facility] for cancer. States "I'm supposed to be in remission."  Past Medical History  Diagnosis Date  . CHF (congestive heart failure)   . CAD (coronary artery disease)   . Hypertension   . Anemia   . Hyperlipidemia   . Sleep apnea   . Obesity   . Diabetes mellitus without complication   . Ulcerative colitis   . Dyspnea   . Cough   . Back pain   . COPD (chronic obstructive pulmonary disease)   . Erectile dysfunction   . Skin cancer   . Anemia of other chronic disease 07/28/2013   Past Surgical History  Procedure Laterality Date  . Cholecystectomy  2007  . Coronary artery bypass graft  1995  . Angioplasty     Family History  Problem Relation Age of Onset  . Heart attack Father   . Diabetes Brother   . Heart  attack Brother   . Emphysema Maternal Grandfather    Social History  Substance Use Topics  . Smoking status: Former Smoker -- 1.00 packs/day    Types: Cigarettes  . Smokeless tobacco: Never Used  . Alcohol Use: No    Review of Systems  Constitutional: Positive for fatigue. Negative for fever.  HENT: Positive for nosebleeds. Negative for sore throat.   Respiratory: Positive for shortness of breath. Negative for cough and chest tightness.   Cardiovascular: Negative.  Negative for chest pain.  Gastrointestinal: Negative.  Negative for nausea, vomiting, abdominal pain, diarrhea and constipation.  Genitourinary: Negative.  Negative for dysuria.  Musculoskeletal: Negative for back pain.  Neurological: Negative for headaches.  All other systems reviewed and are negative.     Allergies  Review of patient's allergies indicates no known allergies.  Home Medications   Prior to Admission medications   Medication Sig Start Date End Date Taking? Authorizing Provider  albuterol (PROVENTIL HFA;VENTOLIN HFA) 108 (90 BASE) MCG/ACT inhaler Inhale 2 puffs into the lungs every 6 (six) hours as needed for wheezing.   Yes Historical Provider, MD  aspirin EC 81 MG tablet Take 81 mg by mouth daily.   Yes Historical Provider, MD  atorvastatin (LIPITOR) 40 MG tablet Take 40 mg by mouth daily.   Yes Historical Provider, MD  clopidogrel (PLAVIX) 75 MG tablet Take 75 mg by mouth daily.   Yes Historical Provider, MD  desvenlafaxine (  PRISTIQ) 50 MG 24 hr tablet Take 50 mg by mouth daily.   Yes Historical Provider, MD  docusate sodium (COLACE) 100 MG capsule Take 100 mg by mouth daily.   Yes Historical Provider, MD  esomeprazole (NEXIUM) 40 MG capsule Take 40 mg by mouth daily before breakfast.   Yes Historical Provider, MD  FLUoxetine (PROZAC) 20 MG capsule Take 20 mg by mouth daily.   Yes Historical Provider, MD  fluticasone (FLONASE) 50 MCG/ACT nasal spray Place 2 sprays into the nose 2 (two) times daily  as needed for rhinitis.   Yes Historical Provider, MD  folic acid (FOLVITE) 1 MG tablet Take 1 mg by mouth daily.   Yes Historical Provider, MD  magnesium oxide (MAG-OX) 400 MG tablet Take 400 mg by mouth daily.   Yes Historical Provider, MD  mercaptopurine (PURINETHOL) 50 MG tablet Take 50 mg by mouth daily. 2 pills daily. Give on an empty stomach 1 hour before or 2 hours after meals. Caution: Chemotherapy.   Yes Historical Provider, MD  metoprolol tartrate (LOPRESSOR) 25 MG tablet Take 25 mg by mouth 2 (two) times daily.   Yes Historical Provider, MD  nitroGLYCERIN (NITROSTAT) 0.4 MG SL tablet Place 0.4 mg under the tongue every 5 (five) minutes as needed for chest pain.   Yes Historical Provider, MD  potassium chloride (K-DUR) 10 MEQ tablet Take 20 mEq by mouth daily.   Yes Historical Provider, MD  spironolactone (ALDACTONE) 25 MG tablet Take 12.5 mg by mouth daily. Take one half 25 mg tablet daily   Yes Historical Provider, MD  tamsulosin (FLOMAX) 0.4 MG CAPS capsule Take 0.4 mg by mouth.   Yes Historical Provider, MD  torsemide (DEMADEX) 20 MG tablet Take 40 mg by mouth 2 (two) times daily. Takes 2 tablets in the morning and 1 tablet in the evening   Yes Historical Provider, MD  vitamin B-12 (CYANOCOBALAMIN) 1000 MCG tablet Take 1,000 mcg by mouth daily.   Yes Historical Provider, MD  carvedilol (COREG) 3.125 MG tablet Take 3.125 mg by mouth 2 (two) times daily with a meal.    Historical Provider, MD  pioglitazone (ACTOS) 30 MG tablet Take 30 mg by mouth daily.    Historical Provider, MD  predniSONE (DELTASONE) 20 MG tablet Take 20 mg by mouth as needed.    Historical Provider, MD  tadalafil (CIALIS) 5 MG tablet Take 5 mg by mouth as needed for erectile dysfunction.    Historical Provider, MD  vardenafil (LEVITRA) 20 MG tablet Take 20 mg by mouth as needed for erectile dysfunction.    Historical Provider, MD   BP 100/45 mmHg  Pulse 85  Temp(Src) 98.1 F (36.7 C) (Oral)  Resp 14  Ht 6'  (1.829 m)  Wt 232 lb (105.235 kg)  BMI 31.46 kg/m2  SpO2 99% Physical Exam  Constitutional: He is oriented to person, place, and time.  Appears older than stated age, raspy voice  HENT:  Head: Normocephalic and atraumatic.  Mouth/Throat: Oropharynx is clear and moist.  Dried blood bilateral nares, no active bleeding  Eyes: Pupils are equal, round, and reactive to light.  Scleral icterus noted  Neck: Neck supple.  Cardiovascular: Normal rate, regular rhythm and normal heart sounds.   No murmur heard. Pulmonary/Chest: Effort normal and breath sounds normal. No respiratory distress. He has no wheezes.  Well-healed midline sternotomy scar  Abdominal: Soft. Bowel sounds are normal. There is no tenderness. There is no rebound and no guarding.  Musculoskeletal: He exhibits edema.  1+ pitting lower extremity edema bilaterally  Neurological: He is alert and oriented to person, place, and time.  Skin: Skin is warm and dry.  Psychiatric: He has a normal mood and affect.  Nursing note and vitals reviewed.   ED Course  Procedures (including critical care time) Labs Review Labs Reviewed  CBC WITH DIFFERENTIAL/PLATELET - Abnormal; Notable for the following:    WBC 3.3 (*)    RBC 2.06 (*)    Hemoglobin 9.1 (*)    HCT 25.0 (*)    MCV 121.4 (*)    MCH 44.2 (*)    MCHC 36.4 (*)    RDW 18.9 (*)    Platelets 58 (*)    Lymphs Abs 0.4 (*)    All other components within normal limits  COMPREHENSIVE METABOLIC PANEL - Abnormal; Notable for the following:    Sodium 132 (*)    Chloride 97 (*)    Glucose, Bld 166 (*)    Creatinine, Ser 0.56 (*)    Calcium 7.7 (*)    Total Protein 5.5 (*)    Albumin 2.0 (*)    AST 88 (*)    ALT 77 (*)    Total Bilirubin 10.4 (*)    All other components within normal limits  URINALYSIS, ROUTINE W REFLEX MICROSCOPIC (NOT AT [facility]) - Abnormal; Notable for the following:    Color, Urine ORANGE (*)    Glucose, UA 100 (*)    Hgb urine dipstick TRACE (*)     Bilirubin Urine LARGE (*)    Ketones, ur TRACE (*)    Protein, ur 30 (*)    Urobilinogen, UA >8.0 (*)    Nitrite POSITIVE (*)    All other components within normal limits  BRAIN NATRIURETIC PEPTIDE - Abnormal; Notable for the following:    B Natriuretic Peptide 229.0 (*)    All other components within normal limits  PROTIME-INR - Abnormal; Notable for the following:    Prothrombin Time 27.3 (*)    INR 2.58 (*)    All other components within normal limits  URINE MICROSCOPIC-ADD ON - Abnormal; Notable for the following:    Bacteria, UA FEW (*)    All other components within normal limits  ACETAMINOPHEN LEVEL - Abnormal; Notable for the following:    Acetaminophen (Tylenol), Serum <10 (*)    All other components within normal limits  BILIRUBIN, DIRECT - Abnormal; Notable for the following:    Bilirubin, Direct 4.8 (*)    All other components within normal limits  URINE CULTURE  TROPONIN I    Imaging Review Ct Abdomen Pelvis W Contrast  04/24/2015   CLINICAL DATA:  New onset jaundice.  Weakness.  EXAM: CT ABDOMEN AND PELVIS WITH CONTRAST  TECHNIQUE: Multidetector CT imaging of the abdomen and pelvis was performed using the standard protocol following bolus administration of intravenous contrast.  CONTRAST:  59mL OMNIPAQUE IOHEXOL 300 MG/ML SOLN, 171mL OMNIPAQUE IOHEXOL 300 MG/ML SOLN  COMPARISON:  Radiographs earlier this day.  CT 10/06/2012  FINDINGS: Lower chest: Atelectasis in the right middle lobe. Dependent atelectasis in the left lower lobe with trace left pleural effusion.  Liver: Nodular contours consistent with cirrhosis. Increased nodularity compared to prior exam. No focal lesion.  Hepatobiliary: Postcholecystectomy with clips in the gallbladder fossa. No biliary dilatation. Portal vein appears patent.  Pancreas: No ductal dilatation.  No evidence of focal lesion.  Spleen: Prominent in size. Maximal dimension 13 cm, splenic volume of 566 mL.  Adrenal glands: No nodule.  Mild left  adrenal thickening.  Kidneys: Symmetric renal enhancement. No hydronephrosis. Calcifications in the right renal hila, likely combination of vascular and nonobstructing stone. No focal renal lesion.  Stomach/Bowel: Mild gastric wall thickening about the greater curvature. Multiple perigastric and paraesophageal varices. Mild small bowel wall thickening involving the upper abdominal large bowel loops. The cecum and ascending colonic thickening may reflect portal enteropathy. Moderate volume of stool throughout the colon without colonic wall thickening. The appendix is normal.  Vascular/Lymphatic: Multiple perigastric and paraesophageal varices. Multiple varices in the left upper quadrant of the abdomen. Splenorenal shunting noted. There is recannulization of the umbilical vein. No retroperitoneal adenopathy. Abdominal aorta is normal in caliber. Atherosclerosis of the aorta without aneurysm.  Reproductive: Prostate gland is normal in size.  Bladder: Minimally distended.  Other: Moderate volume of intra-abdominal and pelvic ascites. No free air. No loculated fluid collection or abscess. Mild haziness to the mesenteric consistent with third-spacing. Mild whole body wall edema. There is fat in both inguinal canals.  Musculoskeletal: There are no acute or suspicious osseous abnormalities. Degenerative change in the spine.  IMPRESSION: 1. Progressive cirrhosis. Increased perigastric and paraesophageal, left upper quadrant varices with portosystemic shunting and moderate intra-abdominal ascites. No focal hepatic lesion. 2. Gastric, small bowel, and ascending colonic wall thickening. Findings suggest portal enteropathy. 3. Mild splenomegaly.   Electronically Signed   By: Jeb Levering M.D.   On: 05/02/2015 06:51   Dg Abd Acute W/chest  05/04/2015   CLINICAL DATA:  Weakness and shortness of breath.  EXAM: DG ABDOMEN ACUTE W/ 1V CHEST  COMPARISON:  CT 10/06/2012  FINDINGS: Patient is post median sternotomy. The heart is  enlarged. There is vascular congestion. Probable left basilar atelectasis. No large pleural effusion.  No dilated bowel loops to suggest obstruction. No free intra-abdominal air. Hazy opacity throughout the abdomen can be seen with intra-abdominal ascites. Surgical clips in the right upper quadrant from prior cholecystectomy. No acute osseous abnormalities.  IMPRESSION: 1. Nonobstructive bowel gas pattern. Hazy opacity throughout the abdomen, can be seen with ascites. 2. Cardiomegaly with vascular congestion.   Electronically Signed   By: Jeb Levering M.D.   On: 04/30/2015 05:25   I have personally reviewed and evaluated these images and lab results as part of my medical decision-making.   EKG Interpretation   Date/Time:  Thursday May 03 2015 04:23:10 EDT Ventricular Rate:  81 PR Interval:  126 QRS Duration: 90 QT Interval:  407 QTC Calculation: 472 R Axis:   67 Text Interpretation:  Sinus rhythm Low voltage, extremity leads Borderline  repolarization abnormality Baseline wander in lead(s) V1 Confirmed by  Kane Kusek  MD, Muir ([id number]) on 05/10/2015 4:50:11 AM      MDM   Final diagnoses:  Acute liver failure  Thrombocytopenia  History of cirrhosis   Patient presents with generalized weakness, epistaxis and poor by mouth intake. Chronically ill appearing on exam. Jaundiced. Vital signs are reassuring; however, blood pressure borderline with systolics in the 427C. Patient has a history of hypertension. Abdomen is soft.  Lab work obtained. Lab work shows stable anemia when compared to lab work at [facility]. He is thrombocytopenic with platelet count of 58. INR is elevated. Patient is not on Coumadin. Suspect liver failure given jaundice. CMP notable for bilirubin of 10.  It appears to be a mixed hyperbilirubinemia.  Given symptoms, CT of the abdomen was obtained. Shows increased. Gastric and paraesophageal varices as well as intra-abdominal ascites. Likely related to cirrhosis.  Patient  states  that he had a GI doctor in [place] for his ulcerative colitis. That doctor has sent retired.  He prefers to stay at [facility] if at all possible. Most of his other specialty care is at [facility] (Oncology).    Merryl Hacker, MD 04/22/2015 [phone]

## 2015-05-03 NOTE — Progress Notes (Signed)
Patient refused to take actos because he stated he no longer takes. Patient also refused insulin with dinner. Blood sugar 149 and only due 1 unit. Dr. Jerilee Hoh paged to notify.

## 2015-05-04 ENCOUNTER — Observation Stay (HOSPITAL_COMMUNITY): Payer: Medicare PPO

## 2015-05-04 ENCOUNTER — Encounter (HOSPITAL_COMMUNITY): Payer: Self-pay | Admitting: Internal Medicine

## 2015-05-04 ENCOUNTER — Other Ambulatory Visit (HOSPITAL_COMMUNITY): Payer: Medicare PPO

## 2015-05-04 DIAGNOSIS — R7989 Other specified abnormal findings of blood chemistry: Secondary | ICD-10-CM | POA: Diagnosis not present

## 2015-05-04 DIAGNOSIS — J690 Pneumonitis due to inhalation of food and vomit: Secondary | ICD-10-CM | POA: Diagnosis not present

## 2015-05-04 DIAGNOSIS — I272 Other secondary pulmonary hypertension: Secondary | ICD-10-CM | POA: Diagnosis present

## 2015-05-04 DIAGNOSIS — D638 Anemia in other chronic diseases classified elsewhere: Secondary | ICD-10-CM | POA: Diagnosis present

## 2015-05-04 DIAGNOSIS — Z8719 Personal history of other diseases of the digestive system: Secondary | ICD-10-CM

## 2015-05-04 DIAGNOSIS — Z79899 Other long term (current) drug therapy: Secondary | ICD-10-CM | POA: Diagnosis not present

## 2015-05-04 DIAGNOSIS — K7040 Alcoholic hepatic failure without coma: Secondary | ICD-10-CM | POA: Diagnosis present

## 2015-05-04 DIAGNOSIS — J449 Chronic obstructive pulmonary disease, unspecified: Secondary | ICD-10-CM | POA: Diagnosis present

## 2015-05-04 DIAGNOSIS — I8500 Esophageal varices without bleeding: Secondary | ICD-10-CM | POA: Diagnosis present

## 2015-05-04 DIAGNOSIS — Z85828 Personal history of other malignant neoplasm of skin: Secondary | ICD-10-CM | POA: Diagnosis not present

## 2015-05-04 DIAGNOSIS — R945 Abnormal results of liver function studies: Secondary | ICD-10-CM | POA: Diagnosis not present

## 2015-05-04 DIAGNOSIS — Z833 Family history of diabetes mellitus: Secondary | ICD-10-CM | POA: Diagnosis not present

## 2015-05-04 DIAGNOSIS — Z87891 Personal history of nicotine dependence: Secondary | ICD-10-CM | POA: Diagnosis not present

## 2015-05-04 DIAGNOSIS — K7200 Acute and subacute hepatic failure without coma: Secondary | ICD-10-CM

## 2015-05-04 DIAGNOSIS — G8929 Other chronic pain: Secondary | ICD-10-CM | POA: Diagnosis present

## 2015-05-04 DIAGNOSIS — Z951 Presence of aortocoronary bypass graft: Secondary | ICD-10-CM | POA: Diagnosis not present

## 2015-05-04 DIAGNOSIS — Z515 Encounter for palliative care: Secondary | ICD-10-CM | POA: Diagnosis not present

## 2015-05-04 DIAGNOSIS — I509 Heart failure, unspecified: Secondary | ICD-10-CM | POA: Diagnosis not present

## 2015-05-04 DIAGNOSIS — K7290 Hepatic failure, unspecified without coma: Secondary | ICD-10-CM | POA: Diagnosis not present

## 2015-05-04 DIAGNOSIS — Z955 Presence of coronary angioplasty implant and graft: Secondary | ICD-10-CM | POA: Diagnosis not present

## 2015-05-04 DIAGNOSIS — I2510 Atherosclerotic heart disease of native coronary artery without angina pectoris: Secondary | ICD-10-CM | POA: Diagnosis present

## 2015-05-04 DIAGNOSIS — I350 Nonrheumatic aortic (valve) stenosis: Secondary | ICD-10-CM | POA: Diagnosis present

## 2015-05-04 DIAGNOSIS — E872 Acidosis: Secondary | ICD-10-CM | POA: Diagnosis present

## 2015-05-04 DIAGNOSIS — E119 Type 2 diabetes mellitus without complications: Secondary | ICD-10-CM | POA: Diagnosis present

## 2015-05-04 DIAGNOSIS — Z8249 Family history of ischemic heart disease and other diseases of the circulatory system: Secondary | ICD-10-CM | POA: Diagnosis not present

## 2015-05-04 DIAGNOSIS — D696 Thrombocytopenia, unspecified: Secondary | ICD-10-CM | POA: Diagnosis not present

## 2015-05-04 DIAGNOSIS — I959 Hypotension, unspecified: Secondary | ICD-10-CM | POA: Diagnosis not present

## 2015-05-04 DIAGNOSIS — D689 Coagulation defect, unspecified: Secondary | ICD-10-CM | POA: Diagnosis present

## 2015-05-04 DIAGNOSIS — I8510 Secondary esophageal varices without bleeding: Secondary | ICD-10-CM | POA: Diagnosis present

## 2015-05-04 DIAGNOSIS — Z6831 Body mass index (BMI) 31.0-31.9, adult: Secondary | ICD-10-CM | POA: Diagnosis not present

## 2015-05-04 DIAGNOSIS — K766 Portal hypertension: Secondary | ICD-10-CM | POA: Diagnosis present

## 2015-05-04 DIAGNOSIS — K7031 Alcoholic cirrhosis of liver with ascites: Secondary | ICD-10-CM | POA: Diagnosis present

## 2015-05-04 DIAGNOSIS — K51911 Ulcerative colitis, unspecified with rectal bleeding: Secondary | ICD-10-CM | POA: Diagnosis present

## 2015-05-04 DIAGNOSIS — D61818 Other pancytopenia: Secondary | ICD-10-CM | POA: Diagnosis present

## 2015-05-04 DIAGNOSIS — G9340 Encephalopathy, unspecified: Secondary | ICD-10-CM | POA: Diagnosis not present

## 2015-05-04 DIAGNOSIS — R531 Weakness: Secondary | ICD-10-CM | POA: Diagnosis present

## 2015-05-04 DIAGNOSIS — I5032 Chronic diastolic (congestive) heart failure: Secondary | ICD-10-CM | POA: Diagnosis present

## 2015-05-04 DIAGNOSIS — R1319 Other dysphagia: Secondary | ICD-10-CM | POA: Diagnosis not present

## 2015-05-04 DIAGNOSIS — Z7902 Long term (current) use of antithrombotics/antiplatelets: Secondary | ICD-10-CM | POA: Diagnosis not present

## 2015-05-04 DIAGNOSIS — D539 Nutritional anemia, unspecified: Secondary | ICD-10-CM | POA: Diagnosis present

## 2015-05-04 DIAGNOSIS — Z66 Do not resuscitate: Secondary | ICD-10-CM | POA: Diagnosis present

## 2015-05-04 DIAGNOSIS — Z7982 Long term (current) use of aspirin: Secondary | ICD-10-CM | POA: Diagnosis not present

## 2015-05-04 DIAGNOSIS — Z8521 Personal history of malignant neoplasm of larynx: Secondary | ICD-10-CM | POA: Diagnosis not present

## 2015-05-04 DIAGNOSIS — K5190 Ulcerative colitis, unspecified, without complications: Secondary | ICD-10-CM

## 2015-05-04 DIAGNOSIS — R633 Feeding difficulties: Secondary | ICD-10-CM | POA: Diagnosis present

## 2015-05-04 DIAGNOSIS — F1020 Alcohol dependence, uncomplicated: Secondary | ICD-10-CM | POA: Diagnosis present

## 2015-05-04 DIAGNOSIS — J9602 Acute respiratory failure with hypercapnia: Secondary | ICD-10-CM | POA: Diagnosis not present

## 2015-05-04 DIAGNOSIS — R161 Splenomegaly, not elsewhere classified: Secondary | ICD-10-CM | POA: Diagnosis present

## 2015-05-04 DIAGNOSIS — Z923 Personal history of irradiation: Secondary | ICD-10-CM | POA: Diagnosis not present

## 2015-05-04 DIAGNOSIS — Z85819 Personal history of malignant neoplasm of unspecified site of lip, oral cavity, and pharynx: Secondary | ICD-10-CM | POA: Diagnosis not present

## 2015-05-04 DIAGNOSIS — R1084 Generalized abdominal pain: Secondary | ICD-10-CM | POA: Diagnosis present

## 2015-05-04 DIAGNOSIS — I10 Essential (primary) hypertension: Secondary | ICD-10-CM | POA: Diagnosis present

## 2015-05-04 DIAGNOSIS — E871 Hypo-osmolality and hyponatremia: Secondary | ICD-10-CM | POA: Diagnosis present

## 2015-05-04 DIAGNOSIS — E669 Obesity, unspecified: Secondary | ICD-10-CM

## 2015-05-04 DIAGNOSIS — E785 Hyperlipidemia, unspecified: Secondary | ICD-10-CM | POA: Diagnosis present

## 2015-05-04 DIAGNOSIS — G4730 Sleep apnea, unspecified: Secondary | ICD-10-CM | POA: Diagnosis present

## 2015-05-04 DIAGNOSIS — I5189 Other ill-defined heart diseases: Secondary | ICD-10-CM

## 2015-05-04 DIAGNOSIS — Z825 Family history of asthma and other chronic lower respiratory diseases: Secondary | ICD-10-CM | POA: Diagnosis not present

## 2015-05-04 DIAGNOSIS — J9601 Acute respiratory failure with hypoxia: Secondary | ICD-10-CM | POA: Diagnosis not present

## 2015-05-04 HISTORY — DX: Pulmonary hypertension, unspecified: I27.20

## 2015-05-04 HISTORY — DX: Other ill-defined heart diseases: I51.89

## 2015-05-04 LAB — HEPATIC FUNCTION PANEL
ALBUMIN: 1.9 g/dL — AB (ref 3.5–5.0)
ALK PHOS: 113 U/L (ref 38–126)
ALT: 64 U/L — ABNORMAL HIGH (ref 17–63)
AST: 75 U/L — ABNORMAL HIGH (ref 15–41)
BILIRUBIN INDIRECT: 5 mg/dL — AB (ref 0.3–0.9)
Bilirubin, Direct: 4.4 mg/dL — ABNORMAL HIGH (ref 0.1–0.5)
TOTAL PROTEIN: 5.2 g/dL — AB (ref 6.5–8.1)
Total Bilirubin: 9.4 mg/dL — ABNORMAL HIGH (ref 0.3–1.2)

## 2015-05-04 LAB — BASIC METABOLIC PANEL
ANION GAP: 4 — AB (ref 5–15)
BUN: 16 mg/dL (ref 6–20)
CALCIUM: 7.4 mg/dL — AB (ref 8.9–10.3)
CO2: 29 mmol/L (ref 22–32)
CREATININE: 0.52 mg/dL — AB (ref 0.61–1.24)
Chloride: 99 mmol/L — ABNORMAL LOW (ref 101–111)
Glucose, Bld: 126 mg/dL — ABNORMAL HIGH (ref 65–99)
Potassium: 4.2 mmol/L (ref 3.5–5.1)
Sodium: 132 mmol/L — ABNORMAL LOW (ref 135–145)

## 2015-05-04 LAB — HEPATITIS B SURFACE ANTIBODY,QUALITATIVE: Hep B S Ab: REACTIVE

## 2015-05-04 LAB — CBC
HCT: 24 % — ABNORMAL LOW (ref 39.0–52.0)
HEMOGLOBIN: 8.5 g/dL — AB (ref 13.0–17.0)
MCH: 43.1 pg — ABNORMAL HIGH (ref 26.0–34.0)
MCHC: 35.4 g/dL (ref 30.0–36.0)
MCV: 121.8 fL — ABNORMAL HIGH (ref 78.0–100.0)
PLATELETS: 43 10*3/uL — AB (ref 150–400)
RBC: 1.97 MIL/uL — AB (ref 4.22–5.81)
RDW: 19.7 % — ABNORMAL HIGH (ref 11.5–15.5)
WBC: 3.3 10*3/uL — ABNORMAL LOW (ref 4.0–10.5)

## 2015-05-04 LAB — MISC LABCORP TEST (SEND OUT): LABCORP TEST CODE: 503800

## 2015-05-04 LAB — PROTIME-INR
INR: 2.39 — ABNORMAL HIGH (ref 0.00–1.49)
PROTHROMBIN TIME: 25.8 s — AB (ref 11.6–15.2)

## 2015-05-04 LAB — GLUCOSE, CAPILLARY
GLUCOSE-CAPILLARY: 156 mg/dL — AB (ref 65–99)
GLUCOSE-CAPILLARY: 159 mg/dL — AB (ref 65–99)
GLUCOSE-CAPILLARY: 130 mg/dL — AB (ref 65–99)
Glucose-Capillary: 114 mg/dL — ABNORMAL HIGH (ref 65–99)

## 2015-05-04 LAB — TSH: TSH: 1.679 u[IU]/mL (ref 0.350–4.500)

## 2015-05-04 LAB — HEPATITIS C ANTIBODY: HCV Ab: 0.1 s/co ratio (ref 0.0–0.9)

## 2015-05-04 LAB — HEPATITIS A ANTIBODY, TOTAL: Hep A Total Ab: POSITIVE — AB

## 2015-05-04 LAB — HEPATITIS B SURFACE ANTIGEN: Hepatitis B Surface Ag: NEGATIVE

## 2015-05-04 MED ORDER — PHYTONADIONE 5 MG PO TABS
5.0000 mg | ORAL_TABLET | Freq: Once | ORAL | Status: AC
  Administered 2015-05-04: 5 mg via ORAL
  Filled 2015-05-04: qty 1

## 2015-05-04 MED ORDER — GADOXETATE DISODIUM 0.25 MMOL/ML IV SOLN
10.0000 mL | Freq: Once | INTRAVENOUS | Status: AC | PRN
  Administered 2015-05-04: 10 mL via INTRAVENOUS

## 2015-05-04 MED ORDER — MORPHINE SULFATE ER 15 MG PO TBCR
15.0000 mg | EXTENDED_RELEASE_TABLET | Freq: Two times a day (BID) | ORAL | Status: DC
  Administered 2015-05-04 (×2): 15 mg via ORAL
  Filled 2015-05-04 (×2): qty 1

## 2015-05-04 MED ORDER — SODIUM CHLORIDE 0.9 % IV SOLN
INTRAVENOUS | Status: AC
  Filled 2015-05-04: qty 150

## 2015-05-04 MED ORDER — MAGIC MOUTHWASH W/LIDOCAINE: 15.0000 mL | Freq: Four times a day (QID) | ORAL | Status: DC

## 2015-05-04 MED ORDER — STARCH (THICKENING) PO POWD
ORAL | Status: DC | PRN
  Filled 2015-05-04: qty 227

## 2015-05-04 MED ORDER — MAGIC MOUTHWASH
15.0000 mL | Freq: Four times a day (QID) | ORAL | Status: DC
  Administered 2015-05-04 – 2015-05-06 (×10): 15 mL via ORAL
  Filled 2015-05-04 (×11): qty 15

## 2015-05-04 MED ORDER — SODIUM CHLORIDE 0.9 % IJ SOLN
INTRAMUSCULAR | Status: AC
Start: 2015-05-04 — End: 2015-05-04
  Filled 2015-05-04: qty 15

## 2015-05-04 MED ORDER — LIDOCAINE VISCOUS 2 % MT SOLN
15.0000 mL | Freq: Four times a day (QID) | OROMUCOSAL | Status: DC
  Administered 2015-05-04 – 2015-05-07 (×11): 15 mL via OROMUCOSAL
  Filled 2015-05-04 (×12): qty 15

## 2015-05-04 NOTE — Evaluation (Signed)
Physical Therapy Evaluation Patient Details Name: Antonio Stevens MRN: [id number] DOB: 06/05/1961 Today's Date: 05/04/2015   History of Present Illness  This a 54 year old male with a history of CHF, coronary artery disease, hypertension, hyperlipidemia, diabetes, ulcerative colitis, and cancer of the larynx who presents with weakness, nosebleeds and poor by mouth intake. Patient reports one week history of worsening fatigue and poor by mouth intake. He states he saw his primary physician after he noted dark urine and thought he might be passing blood. Denies any flank pain or abdominal pain. Patient does state that he has felt bloated. He also reports at times short of breath. Shortness of breath worse with laying flat. No exertional component. Denies any chest pain to me but reported chest pain to nursing. Patient also reports 2 to three-day history of worsening sore throat. States that he normally takes a mouthwash for recurrent throat issues but has been out for several days. Denies any fevers or coughs.  Clinical Impression   Pt reports feeling stronger this AM but still lacks overall energy.  He lives alone and is normally independent with all ADLs.  He has a neighbor who assists him as needed.  Pt is found to have good intrinsic strength but has poor endurance.  He was instructed in gait using a walker and this significantly improved his gait endurance.  He has a walker at home.  I am recommending HHPT at d/c and his agrees to this.  He would like [place] aide service to assist his bathing.    Follow Up Recommendations Home health PT    Equipment Recommendations  None recommended by PT    Recommendations for Other Services   none    Precautions / Restrictions Precautions Precautions: Fall Restrictions Weight Bearing Restrictions: No      Mobility  Bed Mobility Overal bed mobility: Independent                Transfers Overall transfer level: Independent                   Ambulation/Gait Ambulation/Gait assistance: Modified independent (Device/Increase time) Ambulation Distance (Feet): 100 Feet Assistive device: Rolling walker (2 wheeled);None Gait Pattern/deviations: WFL(Within Functional Limits)   Gait velocity interpretation: >2.62 ft/sec, indicative of independent community ambulator General Gait Details: pt is only able to ambulate 20' with no assistive device, very slow deliberate gait...he was instructed in use of a walker and this significantly decreased the work of walking and alllowed him to walk 100' with a more normal pace  Financial [employer] (Stroke Patients Only)       Balance Overall balance assessment: No apparent balance deficits (not formally assessed)                                           Pertinent Vitals/Pain Pain Assessment: No/denies pain    Home Living Family/patient expects to be discharged to:: Private residence Living Arrangements: Alone Available Help at Discharge: Friend(s);Available PRN/intermittently Type of Home: House Home Access: Level entry     Home Layout: One level Home Equipment: Walker - 2 wheels      Prior Function Level of Independence: Independent               Hand Dominance  Extremity/Trunk Assessment   Upper Extremity Assessment: Overall WFL for tasks assessed           Lower Extremity Assessment: Overall WFL for tasks assessed (pt is deconditioned)      Cervical / Trunk Assessment: Normal  Communication   Communication: No difficulties  Cognition Arousal/Alertness: Awake/alert Behavior During Therapy: WFL for tasks assessed/performed Overall Cognitive Status: Within Functional Limits for tasks assessed                      General Comments      Exercises        Assessment/Plan    PT Assessment All further PT needs can be met in the next venue of care  PT Diagnosis     PT  Problem List Decreased activity tolerance;Decreased mobility  PT Treatment Interventions     PT Goals (Current goals can be found in the Care Plan section) Acute Rehab PT Goals PT Goal Formulation: All assessment and education complete, DC therapy    Frequency     Barriers to discharge        Co-evaluation               End of Session Equipment Utilized During Treatment: Gait belt Activity Tolerance: Patient tolerated treatment well Patient left: in bed;with call bell/phone within reach;with bed alarm set      Functional Assessment Tool Used: clinical judgement Functional Limitation: Mobility: Walking and moving around Mobility: Walking and Moving Around Current Status ([id number]): At least 1 percent but less than 20 percent impaired, limited or restricted Mobility: Walking and Moving Around Goal Status [phone]): At least 1 percent but less than 20 percent impaired, limited or restricted Mobility: Walking and Moving Around Discharge Status [phone]): At least 1 percent but less than 20 percent impaired, limited or restricted    Time: 0850-0916 PT Time Calculation (min) (ACUTE ONLY): 26 min   Charges:   PT Evaluation $Initial PT Evaluation Tier I: 1 Procedure     PT G Codes:   PT G-Codes **NOT FOR INPATIENT CLASS** Functional Assessment Tool Used: clinical judgement Functional Limitation: Mobility: Walking and moving around Mobility: Walking and Moving Around Current Status ([id number]): At least 1 percent but less than 20 percent impaired, limited or restricted Mobility: Walking and Moving Around Goal Status [phone]): At least 1 percent but less than 20 percent impaired, limited or restricted Mobility: Walking and Moving Around Discharge Status [phone]): At least 1 percent but less than 20 percent impaired, limited or restricted    Sable Feil  PT 05/04/2015, 9:24 AM [phone]

## 2015-05-04 NOTE — Evaluation (Signed)
Clinical/Bedside Swallow Evaluation Patient Details  Name: Antonio Stevens MRN: [id number] Date of Birth: 15-Feb-1961  Today's Date: 05/04/2015 Time: SLP Start Time (ACUTE ONLY): 69 SLP Stop Time (ACUTE ONLY): 1610 SLP Time Calculation (min) (ACUTE ONLY): 33 min  Past Medical History:  Past Medical History  Diagnosis Date  . CHF (congestive heart failure)   . CAD (coronary artery disease)   . Hypertension   . Anemia   . Hyperlipidemia   . Sleep apnea     does not use CPAP  . Obesity   . Diabetes mellitus without complication     patient denies since weight loss from throat cancer treatment  . Ulcerative colitis     Diagnosed in late 60s. Dr. West Carbo before retired, last seen in 2013  . Dyspnea   . Cough   . Back pain   . COPD (chronic obstructive pulmonary disease)   . Erectile dysfunction   . Skin cancer   . Anemia of other chronic disease 07/28/2013  . Throat cancer     XRT, followed at [facility]  . Ascites   . Gastric varices   . Varices, esophageal   . Cirrhosis    Past Surgical History:  Past Surgical History  Procedure Laterality Date  . Cholecystectomy  2007  . Coronary artery bypass graft  1995  . Angioplasty    . Coronary stent placement  07/2014  . Rotator cuff repair    . Tonsillectomy    . Nasal septum surgery    . Colonoscopy      Dr. West Carbo, after age 62  . Esophagogastroduodenoscopy      Dr. West Carbo  . Knee arthroscopy     HPI:  Antonio Stevens is a very pleasant 54 y.o. 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 Initial evaluation in the emergency department reveals jaundiced patient with hypotension thrombocytopenia elevated INR. Patient states for the last 2 weeks he's experienced progressive  worsening generalized weakness associated with poor appetite. Yesterday he developed epistaxis nausea and vomiting with 2 episodes of emesis   Assessment / Plan / Recommendation Clinical Impression  Pt reports recent onset of swallowing difficulty characterized by odynophagia, globus sensation, and increased coughing during PO consumption. Pt two years post radiation to larynx. Bedside PO trials this date exhibited overt signs and symptoms of poor airway protection concerning for aspiration. Noted  immediate coughing, following ice chips, thin, nectar, and honey thick liquids. Puree trials also evidenced throat clearing and delayed coughing. Recommend MBS to determine safest least restrictive diet. MBS scheduled this date.     Aspiration Risk  Moderate    Diet Recommendation NPO (pending results of MBS this date )        Other  Recommendations Oral Care Recommendations: Oral care BID   Follow Up Recommendations       Frequency and Duration min 2x/week  1 week   Pertinent Vitals/Pain     SLP Swallow Goals     Swallow Study Prior Functional Status       General Date of Onset: 05/06/2015 Other Pertinent Information: Antonio Stevens is a very pleasant 54 y.o. 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 Initial evaluation in the emergency department reveals jaundiced patient with hypotension thrombocytopenia elevated INR. Patient states for the last 2 weeks he's experienced progressive worsening generalized weakness associated with poor appetite. Yesterday he developed epistaxis nausea and vomiting with 2 episodes of emesis Type of Study: Bedside swallow evaluation Diet Prior to this Study: Regular;NPO Temperature Spikes Noted: No Respiratory  Status: Room air History of Recent Intubation: No Behavior/Cognition: Alert;Pleasant mood;Cooperative Oral Cavity - Dentition: Missing dentition Self-Feeding Abilities: Able to feed self Patient Positioning: Upright in bed Baseline Vocal Quality: Other (comment) (harsh, strangled, strained) Volitional Cough: Congested;Strong Volitional Swallow: Able to elicit    Oral/Motor/Sensory Function Overall Oral Motor/Sensory Function: Appears within functional limits for tasks assessed   Ice Chips Ice chips: Impaired Presentation: Cup;Spoon Oral Phase Impairments: Impaired anterior to posterior transit Oral Phase Functional Implications: Prolonged oral transit Pharyngeal Phase Impairments: Suspected delayed Swallow;Wet Vocal Quality;Cough - Immediate   Thin Liquid Thin Liquid: Impaired Presentation: Spoon;Cup;Straw Oral Phase Impairments: Impaired anterior to posterior transit Oral Phase Functional Implications: Prolonged oral transit Pharyngeal  Phase Impairments: Suspected delayed Swallow;Wet Vocal Quality;Multiple swallows;Throat Clearing - Immediate;Cough - Immediate    Nectar Thick Nectar Thick Liquid: Impaired Presentation: Spoon;Cup Oral phase functional implications: Prolonged oral transit Pharyngeal Phase Impairments: Suspected delayed Swallow;Multiple swallows;Wet Vocal Quality;Throat Clearing - Immediate;Cough - Immediate   Honey Thick Honey Thick Liquid: Impaired Presentation: Cup;Straw Oral Phase Functional Implications: Prolonged oral transit Pharyngeal Phase Impairments: Suspected delayed Swallow;Wet Vocal Quality;Multiple swallows;Cough - Immediate   Puree Puree: Impaired Presentation: Spoon Oral Phase Functional Implications: Prolonged oral transit Pharyngeal Phase Impairments: Suspected delayed Swallow;Multiple swallows;Throat Clearing - Immediate   Solid   GO Functional Assessment Tool Used: skilled observation of PO Functional Limitations: Swallowing Swallow Current  Status (V6979): At least 80 percent but less than 100 percent impaired, limited or restricted Swallow Goal Status [phone]): At least 60 percent but less than 80 percent impaired, limited or restricted  Solid: Not tested (Pt refused solid, states difficulty )       Arvil Chaco MA, [place] Speech Language Pathologist    Levi Aland 05/04/2015,4:27 PM

## 2015-05-04 NOTE — Progress Notes (Addendum)
[facility] PROGRESS NOTE  Antonio Stevens KXF:[id number] DOB: 1960/11/29 DOA: 04/24/2015 PCP: Delphina Cahill, MD  Assessment/Plan: Generalized weakness: likely related to worsening cirrhosis in patient with hx throat cancer s/p radiation and thrush with decreased oral intake and anemia.  Improved this am. Hg trending down slightly and may be dilutions.  Evaluated by PT who recommend HH PT.    decompensated cirrhosis/ Liver failure: per CT as well as lab work and clinical presentation.Chart review indicates lab work done in May of this year at [facility] with AST, alkaline phosphatase and ALT within the limits of normal, total bili 1.6. In addition ultrasound of the spleen at that time yielded borderline splenomegaly. AST/ALT trending down slightly. Alk phos within limits of normal. Total bili 9.4.  Evaluated by GI who opine cholestasis with unclear etiology. Recommending MRI/MRCP to further evaluate as well as echocardiogram to evaluate for right sided HF.  Will order these tests.   Active Problems:  Odynophagia/dysphagia: somewhat chronic secondary to radiation but worsening. Coughing with eating and drinking, increased pain. Will provide MM with lidocaine and request ST for bedside swallow evaluation.   Thrombocytopenia/elevated INR: related to #2. Status post vitamin K 05/06/2015. Chart review indicates platelet 129 12/2014.   Hyponatremia: mild. Appears somewhat chronic. Continue gentle IV fluids. monitor  Varices, esophageal/ Varices, gastric: Home medications include Lopressor which was resumed on admision. No reports hematemesis. Hemodynamically stable. BP stable on low end of normal. Defer to GI recommendation    Ascites: per CT. No wave on exam. Does have sob with lying flat. monitor   CAD (coronary artery disease): no chest pain. EKG with NSR   Hypertension: on low end of normal. Continue BBB   Diabetes mellitus without complication: diet controlled. Will obtain Hg A1c. Serum  glucose 166 on admission. Will use SSI for optimal control   Ulcerative colitis: stable. See GI progress note.    COPD (chronic obstructive pulmonary disease): stable at baseline. Continue prn nebs   Throat cancer: s/p radiation. Reports being in "remission"      Code Status: full Family Communication: none present Disposition Plan: home hopefully 24-48 hours   Consultants:  gastroenterology  Procedures:  none  Antibiotics:  none  HPI/Subjective: Sitting up in bed eating. Frequent coughing and clearing of throat. Complains worsening pain with swallowing.   Objective: Filed Vitals:   05/04/15 1509  BP: 107/49  Pulse: 82  Temp: 98 F (36.7 C)  Resp: 16    Intake/Output Summary (Last 24 hours) at 05/04/15 1615 Last data filed at 05/04/15 1613  Gross per 24 hour  Intake 1246.67 ml  Output    725 ml  Net 521.67 ml   Filed Weights   05/01/2015 0428 05/15/2015 0832  Weight: 105.235 kg (232 lb) 105.235 kg (232 lb)    Exam:   General:  Appears chronically ill, comfortable jaundiced  Cardiovascular: rrr, +murmur no gallup trace LE edema  Respiratory: normal effort BS clear bilaterally no wheeze  Abdomen: non-distended +BS mild tenderness umbilical area no guarding or rebounding  Musculoskeletal: joints without swelling/erythema   Data Reviewed: Basic Metabolic Panel:  Recent Labs Lab 05/17/2015 0430 05/04/15 0618  NA 132* 132*  K 3.8 4.2  CL 97* 99*  CO2 30 29  GLUCOSE 166* 126*  BUN 16 16  CREATININE 0.56* 0.52*  CALCIUM 7.7* 7.4*   Liver Function Tests:  Recent Labs Lab 05/08/2015 0430 05/04/15 0618  AST 88* 75*  ALT 77* 64*  ALKPHOS 121 113  BILITOT  10.4* 9.4*  PROT 5.5* 5.2*  ALBUMIN 2.0* 1.9*   No results for input(s): LIPASE, AMYLASE in the last 168 hours. No results for input(s): AMMONIA in the last 168 hours. CBC:  Recent Labs Lab 04/23/2015 0430 05/04/15 0618  WBC 3.3* 3.3*  NEUTROABS 1.8  --   HGB 9.1* 8.5*  HCT  25.0* 24.0*  MCV 121.4* 121.8*  PLT 58* 43*   Cardiac Enzymes:  Recent Labs Lab 05/05/2015 0430  TROPONINI <0.03   BNP (last 3 results)  Recent Labs  05/17/2015 0430  BNP 229.0*    ProBNP (last 3 results) No results for input(s): PROBNP in the last 8760 hours.  CBG:  Recent Labs Lab 05/09/2015 1710 04/30/2015 2034 05/04/15 0745 05/04/15 1113  GLUCAP 149* 148* 114* 156*    Recent Results (from the past 240 hour(s))  Urine culture     Status: None (Preliminary result)   Collection Time: 04/27/2015  4:37 AM  Result Value Ref Range Status   Specimen Description URINE, CLEAN CATCH  Final   Special Requests NONE  Final   Culture   Final    TOO YOUNG TO READ Performed at [facility]    Report Status PENDING  Incomplete     Studies: Ct Abdomen Pelvis W Contrast  05/18/2015   CLINICAL DATA:  New onset jaundice.  Weakness.  EXAM: CT ABDOMEN AND PELVIS WITH CONTRAST  TECHNIQUE: Multidetector CT imaging of the abdomen and pelvis was performed using the standard protocol following bolus administration of intravenous contrast.  CONTRAST:  60mL OMNIPAQUE IOHEXOL 300 MG/ML SOLN, 163mL OMNIPAQUE IOHEXOL 300 MG/ML SOLN  COMPARISON:  Radiographs earlier this day.  CT 10/06/2012  FINDINGS: Lower chest: Atelectasis in the right middle lobe. Dependent atelectasis in the left lower lobe with trace left pleural effusion.  Liver: Nodular contours consistent with cirrhosis. Increased nodularity compared to prior exam. No focal lesion.  Hepatobiliary: Postcholecystectomy with clips in the gallbladder fossa. No biliary dilatation. Portal vein appears patent.  Pancreas: No ductal dilatation.  No evidence of focal lesion.  Spleen: Prominent in size. Maximal dimension 13 cm, splenic volume of 566 mL.  Adrenal glands: No nodule.  Mild left adrenal thickening.  Kidneys: Symmetric renal enhancement. No hydronephrosis. Calcifications in the right renal hila, likely combination of vascular and  nonobstructing stone. No focal renal lesion.  Stomach/Bowel: Mild gastric wall thickening about the greater curvature. Multiple perigastric and paraesophageal varices. Mild small bowel wall thickening involving the upper abdominal large bowel loops. The cecum and ascending colonic thickening may reflect portal enteropathy. Moderate volume of stool throughout the colon without colonic wall thickening. The appendix is normal.  Vascular/Lymphatic: Multiple perigastric and paraesophageal varices. Multiple varices in the left upper quadrant of the abdomen. Splenorenal shunting noted. There is recannulization of the umbilical vein. No retroperitoneal adenopathy. Abdominal aorta is normal in caliber. Atherosclerosis of the aorta without aneurysm.  Reproductive: Prostate gland is normal in size.  Bladder: Minimally distended.  Other: Moderate volume of intra-abdominal and pelvic ascites. No free air. No loculated fluid collection or abscess. Mild haziness to the mesenteric consistent with third-spacing. Mild whole body wall edema. There is fat in both inguinal canals.  Musculoskeletal: There are no acute or suspicious osseous abnormalities. Degenerative change in the spine.  IMPRESSION: 1. Progressive cirrhosis. Increased perigastric and paraesophageal, left upper quadrant varices with portosystemic shunting and moderate intra-abdominal ascites. No focal hepatic lesion. 2. Gastric, small bowel, and ascending colonic wall thickening. Findings suggest portal enteropathy. 3. Mild  splenomegaly.   Electronically Signed   By: Jeb Levering M.D.   On: 05/10/2015 06:51   Dg Abd Acute W/chest  05/13/2015   CLINICAL DATA:  Weakness and shortness of breath.  EXAM: DG ABDOMEN ACUTE W/ 1V CHEST  COMPARISON:  CT 10/06/2012  FINDINGS: Patient is post median sternotomy. The heart is enlarged. There is vascular congestion. Probable left basilar atelectasis. No large pleural effusion.  No dilated bowel loops to suggest obstruction. No  free intra-abdominal air. Hazy opacity throughout the abdomen can be seen with intra-abdominal ascites. Surgical clips in the right upper quadrant from prior cholecystectomy. No acute osseous abnormalities.  IMPRESSION: 1. Nonobstructive bowel gas pattern. Hazy opacity throughout the abdomen, can be seen with ascites. 2. Cardiomegaly with vascular congestion.   Electronically Signed   By: Jeb Levering M.D.   On: 04/24/2015 05:25    Scheduled Meds: . antiseptic oral rinse  7 mL Mouth Rinse q12n4p  . chlorhexidine  15 mL Mouth Rinse BID  . desvenlafaxine  50 mg Oral Daily  . docusate sodium  100 mg Oral Daily  . fluticasone  2 spray Each Nare Daily  . folic acid  1 mg Oral Daily  . insulin aspart  0-9 Units Subcutaneous TID WC  . magic mouthwash  15 mL Oral QID   And  . lidocaine  15 mL Mouth/Throat QID  . magnesium oxide  400 mg Oral Daily  . metoprolol tartrate  12.5 mg Oral BID  . morphine  15 mg Oral Q12H  . pantoprazole  80 mg Oral Q1200  . sodium chloride      . sodium chloride  3 mL Intravenous Q12H  . sodium chloride      . tamsulosin  0.4 mg Oral Daily  . vitamin B-12  1,000 mcg Oral Daily   Continuous Infusions:   Principal Problem:   Liver failure Active Problems:   Varices, esophageal   Varices, gastric   Alcoholic cirrhosis of liver   Hyperbilirubinemia   Elevated LFTs   Ulcerative colitis   Throat cancer   Ascites   Abdominal pain, generalized   CAD (coronary artery disease)   Essential hypertension   Obesity   Diabetes mellitus without complication   COPD (chronic obstructive pulmonary disease)   Thrombocytopenia   Splenomegaly   Odynophagia   Other pancytopenia    Time spent: 40 minutes    [facility] Pager [phone]. If 7PM-7AM, please contact night-coverage at [email or web address], password [facility] 05/04/2015, 4:15 PM  LOS: 1 day    The above note was entered by nurse practitioner, Ms. Renard Hamper. The patient was seen and examined.  His chart, vital signs, laboratory studies were reviewed. Patient was discussed with nurse practitioner, Ms. Renard Hamper. Agree with her findings and plan with additions below.  Overall, patient is feeling better. 2-D echocardiogram and MRCP ordered by GI and are pending. -We'll change his diet to dysphagia 2 diet given some dysphagia. Defer further workup/recommendations to GI.

## 2015-05-04 NOTE — Progress Notes (Signed)
Subjective: States overall he feels somewhat better. Continued abdominal pain and nausea, though improved. Fatigue improved somewhat, is scheduled to be ambulated today by PT. No additional GI complaints.  Objective: Vital signs in last 24 hours: Temp:  [97.8 F (36.6 C)-98.6 F (37 C)] 98 F (36.7 C) (09/16 0418) Pulse Rate:  [81-95] 83 (09/16 0418) Resp:  [15-17] 16 (09/16 0418) BP: (102-114)/(46-60) 114/54 mmHg (09/16 0418) SpO2:  [97 %-99 %] 99 % (09/16 0418) Last BM Date: 05/05/2015 General:   Alert and oriented, pleasant morbidly obese male. Head:  Normocephalic and atraumatic. Eyes:  Scleral icterus.  Heart:  S1, S2 present, no murmurs noted.  Lungs: Clear to auscultation bilaterally, without wheezing, rales, or rhonchi.  Abdomen:  Bowel sounds present, obese, soft, non-tender, non-distended. No HSM or hernias noted. No rebound or guarding. No masses appreciated Generalized jaundice noted. Pulses:  Normal DP pulses noted. Extremities:  Without clubbing or edema. Neurologic:  Alert and  oriented x4;  grossly normal neurologically. Skin:  Warm and dry, intact without significant lesions.  Psych:  Alert and cooperative. Normal mood and affect.  Intake/Output from previous day: 09/15 0701 - 09/16 0700 In: 1006.7 [P.O.:120; I.V.:886.7] Out: 375 [Urine:375] Intake/Output this shift: Total I/O In: 240 [P.O.:240] Out: 200 [Urine:200]  Lab Results:  Recent Labs  04/29/2015 0430 05/04/15 0618  WBC 3.3* 3.3*  HGB 9.1* 8.5*  HCT 25.0* 24.0*  PLT 58* 43*   BMET  Recent Labs  05/05/2015 0430 05/04/15 0618  NA 132* 132*  K 3.8 4.2  CL 97* 99*  CO2 30 29  GLUCOSE 166* 126*  BUN 16 16  CREATININE 0.56* 0.52*  CALCIUM 7.7* 7.4*   LFT  Recent Labs  05/12/2015 0430 05/04/15 0618  PROT 5.5* 5.2*  ALBUMIN 2.0* 1.9*  AST 88* 75*  ALT 77* 64*  ALKPHOS 121 113  BILITOT 10.4* 9.4*  BILIDIR 4.8* 4.4*  IBILI  --  5.0*   PT/INR  Recent Labs  04/24/2015 0430  05/04/15 0618  LABPROT 27.3* 25.8*  INR 2.58* 2.39*   Hepatitis Panel  Recent Labs  04/24/2015 1411 05/16/2015 1415  HEPBSAG  --  Negative  HCVAB 0.1  --     Studies/Results: Ct Abdomen Pelvis W Contrast  04/27/2015   CLINICAL DATA:  New onset jaundice.  Weakness.  EXAM: CT ABDOMEN AND PELVIS WITH CONTRAST  TECHNIQUE: Multidetector CT imaging of the abdomen and pelvis was performed using the standard protocol following bolus administration of intravenous contrast.  CONTRAST:  41mL OMNIPAQUE IOHEXOL 300 MG/ML SOLN, OMNIPAQUE IOHEXOL 300 MG/ML SOLN  COMPARISON:  Radiographs earlier this day.  CT 10/06/2012  FINDINGS: Lower chest: Atelectasis in the right middle lobe. Dependent atelectasis in the left lower lobe with trace left pleural effusion.  Liver: Nodular contours consistent with cirrhosis. Increased nodularity compared to prior exam. No focal lesion.  Hepatobiliary: Postcholecystectomy with clips in the gallbladder fossa. No biliary dilatation. Portal vein appears patent.  Pancreas: No ductal dilatation.  No evidence of focal lesion.  Spleen: Prominent in size. Maximal dimension 13 cm, splenic volume of 566 mL.  Adrenal glands: No nodule.  Mild left adrenal thickening.  Kidneys: Symmetric renal enhancement. No hydronephrosis. Calcifications in the right renal hila, likely combination of vascular and nonobstructing stone. No focal renal lesion.  Stomach/Bowel: Mild gastric wall thickening about the greater curvature. Multiple perigastric and paraesophageal varices. Mild small bowel wall thickening involving the upper abdominal large bowel loops. The cecum and ascending colonic  thickening may reflect portal enteropathy. Moderate volume of stool throughout the colon without colonic wall thickening. The appendix is normal.  Vascular/Lymphatic: Multiple perigastric and paraesophageal varices. Multiple varices in the left upper quadrant of the abdomen. Splenorenal shunting noted. There is  recannulization of the umbilical vein. No retroperitoneal adenopathy. Abdominal aorta is normal in caliber. Atherosclerosis of the aorta without aneurysm.  Reproductive: Prostate gland is normal in size.  Bladder: Minimally distended.  Other: Moderate volume of intra-abdominal and pelvic ascites. No free air. No loculated fluid collection or abscess. Mild haziness to the mesenteric consistent with third-spacing. Mild whole body wall edema. There is fat in both inguinal canals.  Musculoskeletal: There are no acute or suspicious osseous abnormalities. Degenerative change in the spine.  IMPRESSION: 1. Progressive cirrhosis. Increased perigastric and paraesophageal, left upper quadrant varices with portosystemic shunting and moderate intra-abdominal ascites. No focal hepatic lesion. 2. Gastric, small bowel, and ascending colonic wall thickening. Findings suggest portal enteropathy. 3. Mild splenomegaly.   Electronically Signed   By: Jeb Levering M.D.   On: 04/24/2015 06:51   Dg Abd Acute W/chest  05/09/2015   CLINICAL DATA:  Weakness and shortness of breath.  EXAM: DG ABDOMEN ACUTE W/ 1V CHEST  COMPARISON:  CT 10/06/2012  FINDINGS: Patient is post median sternotomy. The heart is enlarged. There is vascular congestion. Probable left basilar atelectasis. No large pleural effusion.  No dilated bowel loops to suggest obstruction. No free intra-abdominal air. Hazy opacity throughout the abdomen can be seen with intra-abdominal ascites. Surgical clips in the right upper quadrant from prior cholecystectomy. No acute osseous abnormalities.  IMPRESSION: 1. Nonobstructive bowel gas pattern. Hazy opacity throughout the abdomen, can be seen with ascites. 2. Cardiomegaly with vascular congestion.   Electronically Signed   By: Jeb Levering M.D.   On: 05/11/2015 05:25    Assessment: 54 year old gentleman with history of throat cancer status post XRT one year ago complicated by massive weight loss, requirement of PEG  placement (09/2013) for severe odynophagia, subsequently removed, who presents with 2 week history of progressive weakness, anorexia, new jaundice. Gives history of known cirrhosis since 2007, stopped drinking at that time. Extent of previous workup unclear, patient previously followed by Dr. West Carbo. Also with history of UC, well-controlled with 6-MP. Previously failed "injection". Tried several oral medications as well. Last colonoscopy several years ago.  CT documents varices, specifically, perigastric and periesophageal. I could not locate last EGD reports done at [facility]. Patient reports recent vomiting in setting of nosebleeds. With some blood in emesis but felt to be related to nosebleeds. No melena.   Patient now with decompensated cirrhosis of unclear etiology.   Patient with throat pain with swallowing. Has had intermittent [employer] Magic Mouthwash and Diflucan. At increased risk of candida given immunosuppressed state.   Today he is somewhat clinically improved. Feels a little bit better. Is having continued nausea. Zofran is ordered, has not had any yet. Advised him to try. CBC with decline in platelets to 43 (from 58 yesterday) and Hgb to 8.5 (from 9.1), though no overt GI bleed noted. AST/ALT improved, Alk pohos remains normal. Bili improved to 9.4 from 10.4 yesterday. PT/INR improved with single dose vitamin K although remains elevated at 2.39. History of CHF, per Dr. Gala Romney addendum yesterday, possible worsening right heart failure as etiology. Also possibility of occult hepatoma or cholangiocarcinoma given UC history, although CT seems normal.    Today: MELD 27; Child Pugh: Class C   Plan: 1. Continue supportive measures 2.  Additional 5 mg dose Vitamin K today. 3. Monitor liver labs (recheck BMP, HFP, PT/INR tomorrow) 4. MRI/MRCP today 5. Echocardiogram today 6. Await thiopurine metabolites result 7. Continue Zofran for nausea   Walden Field, AGNP-C Adult & Gerontological Nurse  Practitioner [facility]    LOS: 1 day    05/04/2015, 9:47 AM

## 2015-05-04 NOTE — Care Management Note (Signed)
Case Management Note  Patient Details  Name: Antonio Stevens MRN: [id number] Date of Birth: November 01, 1960  Subjective/Objective:                  Pt admitted from home with decompensated liver failure. Pt lives alone and will return home at discharge. Pt stated that he has friends that he can call on if he needs them. Pt stated he has a cane, walker for home use.  Action/Plan: PT recommends HH PT at discharge. Pt would also like an aide. Pt chooses [facility] for for [facility] services. Romualdo Bolk of [facility] is aware and will collect the pts information from the chart. [place] services to start within 48 hours of discharge. No DME needs noted. Weekend staff to call [facility] once orders written. Pt and pts nurse aware of discharge arrangements.  Expected Discharge Date:  05/06/15               Expected Discharge Plan:  [place]  In-House Referral:  NA  Discharge planning Services  CM Consult  Post Acute Care Choice:  Home Health Choice offered to:  Patient  DME Arranged:    DME Agency:     HH Arranged:  PT, Nurse's Aide [place] Agency:  [place]  Status of Service:  Completed, signed off  Medicare Important Message Given:    Date Medicare IM Given:    Medicare IM give by:    Date Additional Medicare IM Given:    Additional Medicare Important Message give by:     If discussed at [place] of Stay Meetings, dates discussed:    Additional Comments:  Joylene Draft, RN 05/04/2015, 12:01 PM

## 2015-05-05 ENCOUNTER — Inpatient Hospital Stay (HOSPITAL_COMMUNITY): Payer: Medicare PPO

## 2015-05-05 ENCOUNTER — Observation Stay (HOSPITAL_COMMUNITY): Payer: Medicare PPO | Admitting: Anesthesiology

## 2015-05-05 ENCOUNTER — Observation Stay (HOSPITAL_COMMUNITY): Payer: Medicare PPO

## 2015-05-05 DIAGNOSIS — I350 Nonrheumatic aortic (valve) stenosis: Secondary | ICD-10-CM | POA: Diagnosis present

## 2015-05-05 DIAGNOSIS — Z85828 Personal history of other malignant neoplasm of skin: Secondary | ICD-10-CM | POA: Diagnosis not present

## 2015-05-05 DIAGNOSIS — R531 Weakness: Secondary | ICD-10-CM | POA: Diagnosis present

## 2015-05-05 DIAGNOSIS — Z8249 Family history of ischemic heart disease and other diseases of the circulatory system: Secondary | ICD-10-CM | POA: Diagnosis not present

## 2015-05-05 DIAGNOSIS — Z951 Presence of aortocoronary bypass graft: Secondary | ICD-10-CM | POA: Diagnosis not present

## 2015-05-05 DIAGNOSIS — J690 Pneumonitis due to inhalation of food and vomit: Secondary | ICD-10-CM | POA: Diagnosis not present

## 2015-05-05 DIAGNOSIS — Z923 Personal history of irradiation: Secondary | ICD-10-CM | POA: Diagnosis not present

## 2015-05-05 DIAGNOSIS — I272 Other secondary pulmonary hypertension: Secondary | ICD-10-CM | POA: Diagnosis present

## 2015-05-05 DIAGNOSIS — G4730 Sleep apnea, unspecified: Secondary | ICD-10-CM | POA: Diagnosis present

## 2015-05-05 DIAGNOSIS — D61818 Other pancytopenia: Secondary | ICD-10-CM | POA: Diagnosis present

## 2015-05-05 DIAGNOSIS — J9602 Acute respiratory failure with hypercapnia: Secondary | ICD-10-CM | POA: Diagnosis not present

## 2015-05-05 DIAGNOSIS — I5032 Chronic diastolic (congestive) heart failure: Secondary | ICD-10-CM | POA: Diagnosis present

## 2015-05-05 DIAGNOSIS — I10 Essential (primary) hypertension: Secondary | ICD-10-CM | POA: Diagnosis present

## 2015-05-05 DIAGNOSIS — K7682 Hepatic encephalopathy: Secondary | ICD-10-CM | POA: Diagnosis not present

## 2015-05-05 DIAGNOSIS — I270 Primary pulmonary hypertension: Secondary | ICD-10-CM

## 2015-05-05 DIAGNOSIS — K51911 Ulcerative colitis, unspecified with rectal bleeding: Secondary | ICD-10-CM | POA: Diagnosis present

## 2015-05-05 DIAGNOSIS — E119 Type 2 diabetes mellitus without complications: Secondary | ICD-10-CM | POA: Diagnosis present

## 2015-05-05 DIAGNOSIS — Z833 Family history of diabetes mellitus: Secondary | ICD-10-CM | POA: Diagnosis not present

## 2015-05-05 DIAGNOSIS — E785 Hyperlipidemia, unspecified: Secondary | ICD-10-CM | POA: Diagnosis present

## 2015-05-05 DIAGNOSIS — R1319 Other dysphagia: Secondary | ICD-10-CM | POA: Diagnosis not present

## 2015-05-05 DIAGNOSIS — Z8521 Personal history of malignant neoplasm of larynx: Secondary | ICD-10-CM | POA: Diagnosis not present

## 2015-05-05 DIAGNOSIS — K7290 Hepatic failure, unspecified without coma: Secondary | ICD-10-CM | POA: Diagnosis not present

## 2015-05-05 DIAGNOSIS — I2510 Atherosclerotic heart disease of native coronary artery without angina pectoris: Secondary | ICD-10-CM | POA: Diagnosis present

## 2015-05-05 DIAGNOSIS — Z7902 Long term (current) use of antithrombotics/antiplatelets: Secondary | ICD-10-CM | POA: Diagnosis not present

## 2015-05-05 DIAGNOSIS — I519 Heart disease, unspecified: Secondary | ICD-10-CM

## 2015-05-05 DIAGNOSIS — Z6831 Body mass index (BMI) 31.0-31.9, adult: Secondary | ICD-10-CM | POA: Diagnosis not present

## 2015-05-05 DIAGNOSIS — Z825 Family history of asthma and other chronic lower respiratory diseases: Secondary | ICD-10-CM | POA: Diagnosis not present

## 2015-05-05 DIAGNOSIS — K7040 Alcoholic hepatic failure without coma: Secondary | ICD-10-CM | POA: Diagnosis present

## 2015-05-05 DIAGNOSIS — Z85819 Personal history of malignant neoplasm of unspecified site of lip, oral cavity, and pharynx: Secondary | ICD-10-CM | POA: Diagnosis not present

## 2015-05-05 DIAGNOSIS — J9601 Acute respiratory failure with hypoxia: Secondary | ICD-10-CM | POA: Diagnosis not present

## 2015-05-05 DIAGNOSIS — Z66 Do not resuscitate: Secondary | ICD-10-CM | POA: Diagnosis present

## 2015-05-05 DIAGNOSIS — D689 Coagulation defect, unspecified: Secondary | ICD-10-CM | POA: Diagnosis present

## 2015-05-05 DIAGNOSIS — I8510 Secondary esophageal varices without bleeding: Secondary | ICD-10-CM | POA: Diagnosis present

## 2015-05-05 DIAGNOSIS — E872 Acidosis: Secondary | ICD-10-CM | POA: Diagnosis present

## 2015-05-05 DIAGNOSIS — K766 Portal hypertension: Secondary | ICD-10-CM | POA: Diagnosis present

## 2015-05-05 DIAGNOSIS — J449 Chronic obstructive pulmonary disease, unspecified: Secondary | ICD-10-CM | POA: Diagnosis present

## 2015-05-05 DIAGNOSIS — D638 Anemia in other chronic diseases classified elsewhere: Secondary | ICD-10-CM | POA: Diagnosis present

## 2015-05-05 DIAGNOSIS — K7031 Alcoholic cirrhosis of liver with ascites: Principal | ICD-10-CM

## 2015-05-05 DIAGNOSIS — G8929 Other chronic pain: Secondary | ICD-10-CM | POA: Diagnosis present

## 2015-05-05 DIAGNOSIS — R633 Feeding difficulties: Secondary | ICD-10-CM | POA: Diagnosis present

## 2015-05-05 DIAGNOSIS — K7200 Acute and subacute hepatic failure without coma: Secondary | ICD-10-CM | POA: Diagnosis not present

## 2015-05-05 DIAGNOSIS — Z955 Presence of coronary angioplasty implant and graft: Secondary | ICD-10-CM | POA: Diagnosis not present

## 2015-05-05 DIAGNOSIS — D539 Nutritional anemia, unspecified: Secondary | ICD-10-CM | POA: Diagnosis present

## 2015-05-05 DIAGNOSIS — I959 Hypotension, unspecified: Secondary | ICD-10-CM | POA: Diagnosis not present

## 2015-05-05 DIAGNOSIS — F1020 Alcohol dependence, uncomplicated: Secondary | ICD-10-CM | POA: Diagnosis present

## 2015-05-05 DIAGNOSIS — Z79899 Other long term (current) drug therapy: Secondary | ICD-10-CM | POA: Diagnosis not present

## 2015-05-05 DIAGNOSIS — R161 Splenomegaly, not elsewhere classified: Secondary | ICD-10-CM | POA: Diagnosis present

## 2015-05-05 DIAGNOSIS — E871 Hypo-osmolality and hyponatremia: Secondary | ICD-10-CM | POA: Diagnosis present

## 2015-05-05 DIAGNOSIS — G9340 Encephalopathy, unspecified: Secondary | ICD-10-CM | POA: Diagnosis not present

## 2015-05-05 DIAGNOSIS — Z515 Encounter for palliative care: Secondary | ICD-10-CM | POA: Diagnosis not present

## 2015-05-05 DIAGNOSIS — Z7982 Long term (current) use of aspirin: Secondary | ICD-10-CM | POA: Diagnosis not present

## 2015-05-05 DIAGNOSIS — Z87891 Personal history of nicotine dependence: Secondary | ICD-10-CM | POA: Diagnosis not present

## 2015-05-05 DIAGNOSIS — D696 Thrombocytopenia, unspecified: Secondary | ICD-10-CM | POA: Diagnosis not present

## 2015-05-05 DIAGNOSIS — I8500 Esophageal varices without bleeding: Secondary | ICD-10-CM | POA: Diagnosis present

## 2015-05-05 LAB — GLUCOSE, CAPILLARY
GLUCOSE-CAPILLARY: 129 mg/dL — AB (ref 65–99)
GLUCOSE-CAPILLARY: 138 mg/dL — AB (ref 65–99)
Glucose-Capillary: 148 mg/dL — ABNORMAL HIGH (ref 65–99)
Glucose-Capillary: 142 mg/dL — ABNORMAL HIGH (ref 65–99)
Glucose-Capillary: 111 mg/dL — ABNORMAL HIGH (ref 65–99)

## 2015-05-05 LAB — BLOOD GAS, ARTERIAL
ACID-BASE DEFICIT: 2.9 mmol/L — AB (ref 0.0–2.0)
Acid-Base Excess: 0.3 mmol/L (ref 0.0–2.0)
Acid-base deficit: 4.8 mmol/L — ABNORMAL HIGH (ref 0.0–2.0)
Bicarbonate: 23.2 meq/L (ref 20.0–24.0)
Bicarbonate: 21.2 mEq/L (ref 20.0–24.0)
Bicarbonate: 19.4 mEq/L — ABNORMAL LOW (ref 20.0–24.0)
DRAWN BY: 23534
Delivery systems: POSITIVE
Delivery systems: POSITIVE
Drawn by: 22223
Drawn by: 23534
Expiratory PAP: 5
Expiratory PAP: 6
FIO2: 55
FIO2: 0.75
INSPIRATORY PAP: 18
Inspiratory PAP: 16
MODE: POSITIVE
Mode: POSITIVE
O2 CONTENT: 75 L/min
O2 Content: 6 L/min
O2 Content: 55 L/min
O2 SAT: 89.2 %
O2 Saturation: 89 %
O2 Saturation: 96.5 %
PCO2 ART: 58.8 mmHg — AB (ref 35.0–45.0)
PCO2 ART: 67.3 mmHg — AB (ref 35.0–45.0)
PH ART: 7.229 — AB (ref 7.350–7.450)
Patient temperature: 37
TCO2: 11.5 mmol/L (ref 0–100)
pCO2 arterial: 65.8 mmHg (ref 35.0–45.0)
pH, Arterial: 7.229 — ABNORMAL LOW (ref 7.350–7.450)
pH, Arterial: 7.155 — CL (ref 7.350–7.450)
pO2, Arterial: 69.3 mmHg — ABNORMAL LOW (ref 80.0–100.0)
pO2, Arterial: 68.8 mmHg — ABNORMAL LOW (ref 80.0–100.0)
pO2, Arterial: 111 mmHg — ABNORMAL HIGH (ref 80.0–100.0)

## 2015-05-05 LAB — CBC
HCT: 26.8 % — ABNORMAL LOW (ref 39.0–52.0)
Hemoglobin: 9.6 g/dL — ABNORMAL LOW (ref 13.0–17.0)
MCH: 43.4 pg — ABNORMAL HIGH (ref 26.0–34.0)
MCHC: 35.8 g/dL (ref 30.0–36.0)
MCV: 121.3 fL — ABNORMAL HIGH (ref 78.0–100.0)
PLATELETS: 55 10*3/uL — AB (ref 150–400)
RBC: 2.21 MIL/uL — AB (ref 4.22–5.81)
RDW: 19.7 % — ABNORMAL HIGH (ref 11.5–15.5)
WBC: 5.8 10*3/uL (ref 4.0–10.5)

## 2015-05-05 LAB — COMPREHENSIVE METABOLIC PANEL
ALBUMIN: 2.2 g/dL — AB (ref 3.5–5.0)
ALT: 69 U/L — ABNORMAL HIGH (ref 17–63)
ANION GAP: 5 (ref 5–15)
AST: 78 U/L — ABNORMAL HIGH (ref 15–41)
Alkaline Phosphatase: 125 U/L (ref 38–126)
BUN: 17 mg/dL (ref 6–20)
CO2: 28 mmol/L (ref 22–32)
Calcium: 7.8 mg/dL — ABNORMAL LOW (ref 8.9–10.3)
Chloride: 96 mmol/L — ABNORMAL LOW (ref 101–111)
Creatinine, Ser: 0.64 mg/dL (ref 0.61–1.24)
GFR calc Af Amer: >60 mL/min (ref >60)
GFR calc non Af Amer: >60 mL/min (ref >60)
GLUCOSE: 151 mg/dL — AB (ref 65–99)
POTASSIUM: 4.5 mmol/L (ref 3.5–5.1)
SODIUM: 129 mmol/L — AB (ref 135–145)
TOTAL PROTEIN: 6.2 g/dL — AB (ref 6.5–8.1)
Total Bilirubin: 12.3 mg/dL — ABNORMAL HIGH (ref 0.3–1.2)

## 2015-05-05 LAB — AMMONIA: Ammonia: 111 umol/L — ABNORMAL HIGH (ref 9–35)

## 2015-05-05 LAB — HEMOGLOBIN A1C
HEMOGLOBIN A1C: 5.3 % (ref 4.8–5.6)
MEAN PLASMA GLUCOSE: 105 mg/dL

## 2015-05-05 LAB — URINE CULTURE

## 2015-05-05 LAB — PROTIME-INR
INR: 2.15 — ABNORMAL HIGH (ref 0.00–1.49)
PROTHROMBIN TIME: 23.8 s — AB (ref 11.6–15.2)

## 2015-05-05 MED ORDER — SUCCINYLCHOLINE CHLORIDE 20 MG/ML IJ SOLN
INTRAMUSCULAR | Status: AC
  Filled 2015-05-05: qty 1

## 2015-05-05 MED ORDER — METHYLPREDNISOLONE SODIUM SUCC 40 MG IJ SOLR
40.0000 mg | Freq: Four times a day (QID) | INTRAMUSCULAR | Status: DC
  Administered 2015-05-05 – 2015-05-07 (×9): 40 mg via INTRAVENOUS
  Filled 2015-05-05 (×8): qty 1

## 2015-05-05 MED ORDER — ANTISEPTIC ORAL RINSE SOLUTION (CORINZ)
7.0000 mL | Freq: Four times a day (QID) | OROMUCOSAL | Status: DC
  Administered 2015-05-05 – 2015-05-06 (×2): 7 mL via OROMUCOSAL

## 2015-05-05 MED ORDER — ALBUTEROL SULFATE (2.5 MG/3ML) 0.083% IN NEBU
2.5000 mg | INHALATION_SOLUTION | RESPIRATORY_TRACT | Status: DC
  Administered 2015-05-05 – 2015-05-07 (×13): 2.5 mg via RESPIRATORY_TRACT
  Filled 2015-05-05 (×13): qty 3

## 2015-05-05 MED ORDER — METOPROLOL TARTRATE 25 MG/10 ML ORAL SUSPENSION
12.5000 mg | Freq: Two times a day (BID) | ORAL | Status: DC
  Filled 2015-05-05 (×5): qty 5

## 2015-05-05 MED ORDER — FENTANYL CITRATE (PF) 100 MCG/2ML IJ SOLN: 100.0000 ug | INTRAMUSCULAR | Status: DC | PRN

## 2015-05-05 MED ORDER — NALOXONE HCL 0.4 MG/ML IJ SOLN
INTRAMUSCULAR | Status: AC
  Administered 2015-05-05: 0.4 mg via INTRAVENOUS
  Filled 2015-05-05: qty 1

## 2015-05-05 MED ORDER — KETAMINE HCL 10 MG/ML IJ SOLN
INTRAMUSCULAR | Status: AC
  Filled 2015-05-05: qty 20

## 2015-05-05 MED ORDER — ETOMIDATE 2 MG/ML IV SOLN
INTRAVENOUS | Status: AC
  Filled 2015-05-05: qty 20

## 2015-05-05 MED ORDER — SODIUM CHLORIDE 0.9 % IJ SOLN
10.0000 mL | Freq: Two times a day (BID) | INTRAMUSCULAR | Status: DC
  Administered 2015-05-07: 10 mL

## 2015-05-05 MED ORDER — PANTOPRAZOLE SODIUM 40 MG IV SOLR: 40.0000 mg | Freq: Every day | INTRAVENOUS | Status: DC

## 2015-05-05 MED ORDER — MIDAZOLAM HCL 2 MG/2ML IJ SOLN: 2.0000 mg | INTRAMUSCULAR | Status: DC | PRN

## 2015-05-05 MED ORDER — KETAMINE HCL 10 MG/ML IJ SOLN
INTRAMUSCULAR | Status: DC | PRN
  Administered 2015-05-05: 10 mg via INTRAVENOUS

## 2015-05-05 MED ORDER — PIPERACILLIN-TAZOBACTAM 3.375 G IVPB
3.3750 g | Freq: Three times a day (TID) | INTRAVENOUS | Status: DC
  Filled 2015-05-05 (×7): qty 50

## 2015-05-05 MED ORDER — SODIUM CHLORIDE 0.9 % IV SOLN: INTRAVENOUS | Status: DC

## 2015-05-05 MED ORDER — SODIUM CHLORIDE 0.9 % IV SOLN
80.0000 mg | Freq: Every day | INTRAVENOUS | Status: DC
  Administered 2015-05-05 – 2015-05-06 (×2): 80 mg via INTRAVENOUS
  Filled 2015-05-05 (×3): qty 80

## 2015-05-05 MED ORDER — FUROSEMIDE 10 MG/ML IJ SOLN
10.0000 mg | INTRAMUSCULAR | Status: AC
  Administered 2015-05-05: 10 mg via INTRAVENOUS
  Filled 2015-05-05: qty 2

## 2015-05-05 MED ORDER — NALOXONE HCL 0.4 MG/ML IJ SOLN
0.4000 mg | INTRAMUSCULAR | Status: DC | PRN
  Administered 2015-05-05: 0.4 mg via INTRAVENOUS

## 2015-05-05 MED ORDER — LACTULOSE ENEMA
300.0000 mL | Freq: Four times a day (QID) | ORAL | Status: DC
  Administered 2015-05-05 – 2015-05-06 (×4): 300 mL via RECTAL
  Filled 2015-05-05 (×9): qty 300

## 2015-05-05 MED ORDER — METOPROLOL TARTRATE 1 MG/ML IV SOLN
5.0000 mg | Freq: Four times a day (QID) | INTRAVENOUS | Status: DC
  Administered 2015-05-05 – 2015-05-06 (×2): 5 mg via INTRAVENOUS
  Filled 2015-05-05 (×2): qty 5

## 2015-05-05 MED ORDER — METOPROLOL TARTRATE 25 MG PO TABS: 12.5000 mg | ORAL_TABLET | Freq: Two times a day (BID) | ORAL | Status: DC

## 2015-05-05 MED ORDER — LACTULOSE ENEMA
300.0000 mL | Freq: Two times a day (BID) | ORAL | Status: DC
  Filled 2015-05-05 (×5): qty 300

## 2015-05-05 MED ORDER — LACTULOSE 10 GM/15ML PO SOLN: 30.0000 g | Freq: Two times a day (BID) | ORAL | Status: DC

## 2015-05-05 MED ORDER — CHLORHEXIDINE GLUCONATE 0.12% ORAL RINSE (MEDLINE KIT)
15.0000 mL | Freq: Two times a day (BID) | OROMUCOSAL | Status: DC
  Administered 2015-05-05 – 2015-05-06 (×2): 15 mL via OROMUCOSAL

## 2015-05-05 MED ORDER — FUROSEMIDE 10 MG/ML IJ SOLN
10.0000 mg | Freq: Once | INTRAMUSCULAR | Status: AC
  Administered 2015-05-05: 10 mg via INTRAVENOUS
  Filled 2015-05-05: qty 2

## 2015-05-05 MED ORDER — FOLIC ACID 5 MG/ML IJ SOLN
1.0000 mg | Freq: Every day | INTRAMUSCULAR | Status: DC
  Administered 2015-05-05 – 2015-05-07 (×3): 1 mg via INTRAVENOUS
  Filled 2015-05-05 (×5): qty 0.2

## 2015-05-05 MED ORDER — SODIUM CHLORIDE 0.9 % IV SOLN
Freq: Once | INTRAVENOUS | Status: DC
Start: 2015-05-05 — End: 2015-05-07

## 2015-05-05 MED ORDER — SODIUM CHLORIDE 0.9 % IJ SOLN: 10.0000 mL | INTRAMUSCULAR | Status: DC | PRN

## 2015-05-05 MED ORDER — LIDOCAINE HCL (CARDIAC) 20 MG/ML IV SOLN
INTRAVENOUS | Status: AC
  Filled 2015-05-05: qty 5

## 2015-05-05 MED ORDER — ROCURONIUM BROMIDE 50 MG/5ML IV SOLN
INTRAVENOUS | Status: AC
  Filled 2015-05-05: qty 2

## 2015-05-05 MED ORDER — PIPERACILLIN-TAZOBACTAM 3.375 G IVPB
3.3750 g | Freq: Three times a day (TID) | INTRAVENOUS | Status: DC
  Administered 2015-05-05 – 2015-05-07 (×6): 3.375 g via INTRAVENOUS
  Filled 2015-05-05 (×10): qty 50

## 2015-05-05 NOTE — Progress Notes (Signed)
Night: CTSP re increase somnolent.  He is arousable, but falling back to sleep.  Admitted for decompensated liver failure, generalized weakness, having hx of chronic hyponatremia and DM, more alert ealier today.  He was given very little amount of morphine 1mg  only. He is still breathing well, with stable hemodynamic. ABG showed CO2 of 65, pH 7.22. Check NH3 given hx of liver cirrhosis. D/C Morphine, transfer to ICU for Bipap.  Thanks.

## 2015-05-05 NOTE — Progress Notes (Addendum)
[facility] PROGRESS NOTE  Antonio Stevens RKY:[id number] DOB: [birth year] DOA: 05/12/2015 PCP: Delphina Cahill, MD    Code Status: Full code Family Communication: Family not available but has been notified regarding pending intubation. Disposition Plan: Discharge when clinically appropriate.  Consultants:  Pulmonary  gastroenterology  Procedures:  Intubation 9/17  2-D echocardiogram 05/04/15:- Left ventricle: The cavity size was mildly dilated. Wall thickness was increased in a pattern of mild LVH. Systolic function was normal. The estimated ejection fraction was in the range of 60% to 65%. Wall motion was normal; there were no regional wall motion abnormalities. Features are consistent with a pseudonormal left ventricular filling pattern, with concomitant abnormal relaxation and increased filling pressure (grade 2 diastolic dysfunction). - Aortic valve: Moderately calcified annulus. Trileaflet; moderately calcified leaflets. Cusp separation was reduced. There was moderate stenosis. Mean gradient (S): 28 mm Hg. Peak gradient (S): 50 mm Hg. VTI ratio of LVOT to aortic valve: 0.68. Valve area (VTI): 1.74 cm^2. Valve area (Vmax): 1.35 cm^2. - Mitral valve: Moderately calcified annulus. There was mild regurgitation. - Left atrium: The atrium was severely dilated. - Right ventricle: Systolic function was normal. - Right atrium: The atrium was mildly dilated. Central venous pressure (est): 8 mm Hg. - Tricuspid valve: There was moderate regurgitation. - Pulmonary arteries: Systolic pressure was moderately increased. PA peak pressure: 58 mm Hg (S). - Pericardium, extracardiac: There was no pericardial effusion. Impressions: - Mild LVH with mild LV chamber dilatation and LVEF 60-65%. Grade 2 diastolic dysfunction with increased filling pressures. Severe left atrial enlargement. Moderately calcified mitral annulus with mild mitral  regurgitation. Moderate calcific aortic stenosis as outlined above. Moderate tricuspid regurgitation with PASP 58 mmHg. Mild right atrial enlargement.  Antibiotics:  Zosyn 9/17  HPI/Subjective: Overnight/early morning, the patient became somnolent and encephalopathic. His ABG revealed hypercapnia and hypoxia. His ammonia level was elevated consistent with hepatic encephalopathy. He was transferred to the ICU and placed on BiPAP. This morning, he is arousable and nods no to pain, but is still encephalopathic.  Objective: Filed Vitals:   05/05/15 0852  BP:   Pulse: 95  Temp:   Resp: 24  Temperature 98. Pulse 95. Respiratory rate 24. Blood pressure 116/61. Oxygen saturation 91% on BiPAP.   Intake/Output Summary (Last 24 hours) at 05/05/15 0905 Last data filed at 05/04/15 1900  Gross per 24 hour  Intake    240 ml  Output    150 ml  Net     90 ml   Filed Weights   05/15/2015 0428 05/10/2015 0832  Weight: 105.235 kg (232 lb) 105.235 kg (232 lb)    Exam:   General:  Obese, nearly obtunded/encephalopathic 54 year old man who appears older than his stated age.  Cardiovascular: S1, S2, with soft systolic murmur.  Respiratory: right greater than left crackles and occasional rhonchi.  Abdomen: obese, ?palpable ascites, nontender, positive bowel sounds.  Musculoskeletal/extremities: Trace of pedal edema. No acute hot red joints.  Neurologic: He is nearly obtunded and encephalopathic. He opens his eyes to voice. He nods no to pain. He does squeeze the examiner's hand bilaterally, but falls back to sleep.   Data Reviewed: Basic Metabolic Panel:  Recent Labs Lab 05/16/2015 0430 05/04/15 0618 05/05/15 0354  NA 132* 132* 129*  K 3.8 4.2 4.5  CL 97* 99* 96*  CO2 30 29 28   GLUCOSE 166* 126* 151*  BUN 16 16 17   CREATININE 0.56* 0.52* 0.64  CALCIUM 7.7* 7.4* 7.8*   Liver Function Tests:  Recent Labs  Lab 05/10/2015 0430 05/11/2015 0618 05/05/15 0354  AST 88* 75* 78*  ALT  77* 64* 69*  ALKPHOS 121 113 125  BILITOT 10.4* 9.4* 12.3*  PROT 5.5* 5.2* 6.2*  ALBUMIN 2.0* 1.9* 2.2*   No results for input(s): LIPASE, AMYLASE in the last 168 hours.  Recent Labs Lab 05/05/15 0354  AMMONIA 111*   CBC:  Recent Labs Lab 05/02/2015 0430 2015-05-11 0618 05/05/15 0354  WBC 3.3* 3.3* 5.8  NEUTROABS 1.8  --   --   HGB 9.1* 8.5* 9.6*  HCT 25.0* 24.0* 26.8*  MCV 121.4* 121.8* 121.3*  PLT 58* 43* 55*   Cardiac Enzymes:  Recent Labs Lab 05/10/2015 0430  TROPONINI <0.03   BNP (last 3 results)  Recent Labs  05/01/2015 0430  BNP 229.0*    ProBNP (last 3 results) No results for input(s): PROBNP in the last 8760 hours.  CBG:  Recent Labs Lab May 11, 2015 1113 2015/05/11 1708 05-11-15 2115 05/05/15 0339 05/05/15 0719  GLUCAP 156* 159* 130* 129* 148*    Recent Results (from the past 240 hour(s))  Urine culture     Status: None (Preliminary result)   Collection Time: 05/18/2015  4:37 AM  Result Value Ref Range Status   Specimen Description URINE, CLEAN CATCH  Final   Special Requests NONE  Final   Culture   Final    TOO YOUNG TO READ Performed at [facility]    Report Status PENDING  Incomplete     Studies: Mr 3d Recon At Scanner  11-May-2015   CLINICAL DATA:  Cirrhosis.  Elevated liver function studies.  EXAM: MRI ABDOMEN WITHOUT AND WITH CONTRAST (INCLUDING MRCP)  TECHNIQUE: Multiplanar multisequence MR imaging of the abdomen was performed both before and after the administration of intravenous contrast. Heavily T2-weighted images of the biliary and pancreatic ducts were obtained, and three-dimensional MRCP images were rendered by post processing.  CONTRAST:  10 cc MultiHance  COMPARISON:  CT scan 04/22/2015  FINDINGS: Examination is severely limited by breathing motion artifact.  There is a left pleural effusion and overlying atelectasis. No pericardial effusion.  There are cirrhotic changes involving the liver but no focal hepatic lesions are  identified. No biliary dilatation. Mild splenomegaly is noted. There are extensive portal venous collaterals and esophageal varices. A splenorenal shunt is noted. Suspect cavernous transformation of the portal vein. The pancreas is grossly normal. No ductal dilatation, inflammation or mass.  The stomach, duodenum, small bowel and colon are grossly normal. No mesenteric or retroperitoneal mass or adenopathy. The aorta and branch vessels are patent.  Moderate ascites.  IMPRESSION: Very limited examination due to respiratory motion.  No obvious hepatic lesions or intrahepatic biliary dilatation. The pancreaticobiliary tree appears normal.  Cirrhosis with portal venous hypertension and large portal venous collaterals and esophageal varices. Moderate ascites.   Electronically Signed   By: Marijo Sanes M.D.   On: 11-May-2015 21:24   Dg Chest Port 1 View  05/05/2015   CLINICAL DATA:  Respiratory failure.  EXAM: PORTABLE CHEST - 1 VIEW  COMPARISON:  04/23/2015  FINDINGS: There is right lower lobe airspace disease. The lungs are otherwise clear. There is no pleural effusion or pneumothorax. There is stable cardiomegaly. There is evidence of prior median sternotomy. There is evidence of prior CABG.  The osseous structures are unremarkable.  IMPRESSION: Right lower lobe airspace disease most concerning for atelectasis versus pneumonia. Follow-up radiography is recommended.   Electronically Signed   By: Kathreen Devoid   On: 05/05/2015  08:52   Dg Swallowing Func-speech Pathology  05/04/2015    Objective Swallowing Evaluation:    Patient Details  Name: Antonio Stevens MRN: [id number] Date of Birth: Feb 03, 1961  Today's Date: 05/04/2015 Time: SLP Start Time (ACUTE ONLY): 1640-SLP Stop Time (ACUTE ONLY): 1659 SLP Time Calculation (min) (ACUTE ONLY): 19 min  Past Medical History:  Past Medical History  Diagnosis Date  . CHF (congestive heart failure)   . CAD (coronary artery disease)   . Hypertension   . Anemia   . Hyperlipidemia    . Sleep apnea     does not use CPAP  . Obesity   . Diabetes mellitus without complication     patient denies since weight loss from throat cancer treatment  . Ulcerative colitis     Diagnosed in late 33s. Dr. West Carbo before retired, last seen in 2013  . Dyspnea   . Cough   . Back pain   . COPD (chronic obstructive pulmonary disease)   . Erectile dysfunction   . Skin cancer   . Anemia of other chronic disease 07/28/2013  . Throat cancer     XRT, followed at [facility]  . Ascites   . Gastric varices   . Varices, esophageal   . Cirrhosis    Past Surgical History:  Past Surgical History  Procedure Laterality Date  . Cholecystectomy  2007  . Coronary artery bypass graft  1995  . Angioplasty    . Coronary stent placement  07/2014  . Rotator cuff repair    . Tonsillectomy    . Nasal septum surgery    . Colonoscopy      Dr. West Carbo, after age 34  . Esophagogastroduodenoscopy      Dr. West Carbo  . Knee arthroscopy     HPI:  Other Pertinent Information: Antonio Stevens is a very pleasant 54 y.o.  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 Initial evaluation in the emergency department reveals jaundiced  patient with hypotension thrombocytopenia elevated INR. Patient states for  the last 2 weeks he's experienced progressive worsening generalized  weakness associated with poor appetite. Yesterday he developed epistaxis  nausea and vomiting with 2 episodes of emesis  No Data Recorded  Assessment / Plan / Recommendation CHL IP CLINICAL IMPRESSIONS 05/04/2015  Therapy Diagnosis Moderate oral phase dysphagia;Moderate pharyngeal phase  dysphagia  Clinical Impression PT presents with moderate oropharyngeal dysphagia  suspect secondary to previous radiation to larynx. Delay in swallow   evidenced with all consistencies. Thin liquid consistencies by teaspoon  yielded deep penetration to the vocal cords that cleared following the  swallow. Aspiration noted with thin liquids by cup which was not  consistently sensed by the pt. Chin tuck strategy also not effective in  preventing aspiration. Pt with better protection of airway with nectar  thick liquids when given small sips. Larger sips resulted in penetration  to the vocal cords, however no aspiration. Vallecular residuals present  with all PO secondary to poor base of tongue retraction. Penetration of  residuals noted after the swallow throughout MBS.  Suspect across a meal  residuals would become aspirated. Honey thick liquid trials resulted in  increased vallecular residuals. Pt with difficulty passing bairum tablet  throughout pharynx and esophagus. Pill remained in valleculae up to 2  minutes despite  liquid wash down and was also static in esophagus 30  seconds before passing.  Pt reports difficulty with solid PO. Recommend  dysphagia 2 (chopped) and nectar thick liquids. Medicines crushed with  puree. Full supervision to reinforce safe swallow strategies. ST to follow  up.         CHL IP TREATMENT RECOMMENDATION 05/04/2015  Treatment Recommendations Therapy as outlined in treatment plan below     CHL IP DIET RECOMMENDATION 05/04/2015  SLP Diet Recommendations Dysphagia 2 (Fine chop);Nectar  Liquid Administration via (None)  Medication Administration Crushed with puree  Compensations Slow rate;Small sips/bites;Multiple dry swallows after each  bite/sip  Postural Changes and/or Swallow Maneuvers (None)     CHL IP OTHER RECOMMENDATIONS 05/04/2015  Recommended Consults (None)  Oral Care Recommendations Oral care BID  Other Recommendations Order thickener from pharmacy     No flowsheet data found.   CHL IP FREQUENCY AND DURATION 05/04/2015  Speech Therapy Frequency (ACUTE ONLY) min 2x/week  Treatment Duration 1 week     Pertinent Vitals/Pain     SLP  Swallow Goals No flowsheet data found.  No flowsheet data found.    CHL IP REASON FOR REFERRAL 05/04/2015  Reason for Referral Objectively evaluate swallowing function            No flowsheet data found.  CHL IP GO 05/04/2015  Functional Assessment Tool Used skilled observation of PO  Functional Limitations Swallowing  Swallow Current Status (T7322) CM  Swallow Goal Status (G2542) CL  Swallow Discharge Status (H0623) (None)  Motor Speech Current Status (J6283) (None)  Motor Speech Goal Status (T5176) (None)  Motor Speech Goal Status (H6073) (None)  Spoken Language Comprehension Current Status (X1062) (None)  Spoken Language Comprehension Goal Status (I9485) (None)  Spoken Language Comprehension Discharge Status [phone]) (None)  Spoken Language Expression Current Status [phone]) (None)  Spoken Language Expression Goal Status [phone]) (None)  Spoken Language Expression Discharge Status [phone]) (None)  Attention Current Status (I9678) (None)  Attention Goal Status (L3810) (None)  Attention Discharge Status [phone]) (None)  Memory Current Status (C5852) (None)  Memory Goal Status (D7824) (None)  Memory Discharge Status (M3536) (None)  Voice Current Status (R4431) (None)  Voice Goal Status (V4008) (None)  Voice Discharge Status (Q7619) (None)  Other Speech-Language Pathology Functional Limitation [phone]) (None)  Other Speech-Language Pathology Functional Limitation Goal Status (I7124)  (None)  Other Speech-Language Pathology Functional Limitation Discharge Status  [phone]) (None)          Arvil Chaco MA, CCC-SLP Acute Care Speech Language Pathologist    Levi Aland 05/04/2015, 5:20 PM    Mr Abd W/wo Cm/mrcp  05/04/2015   CLINICAL DATA:  Cirrhosis.  Elevated liver function studies.  EXAM: MRI ABDOMEN WITHOUT AND WITH CONTRAST (INCLUDING MRCP)  TECHNIQUE: Multiplanar multisequence MR imaging of the abdomen was performed both before and after the administration of intravenous contrast. Heavily T2-weighted images of the  biliary and pancreatic ducts were obtained, and three-dimensional MRCP images were rendered by post processing.  CONTRAST:  10 cc MultiHance  COMPARISON:  CT scan 05/04/2015  FINDINGS: Examination is severely limited by breathing motion artifact.  There is a left pleural effusion and overlying atelectasis. No pericardial effusion.  There are cirrhotic changes involving the liver but no focal hepatic lesions are identified. No biliary dilatation. Mild splenomegaly is noted. There are extensive portal venous collaterals and esophageal varices. A splenorenal shunt is noted. Suspect cavernous transformation of the portal vein. The pancreas is grossly normal. No ductal dilatation,  inflammation or mass.  The stomach, duodenum, small bowel and colon are grossly normal. No mesenteric or retroperitoneal mass or adenopathy. The aorta and branch vessels are patent.  Moderate ascites.  IMPRESSION: Very limited examination due to respiratory motion.  No obvious hepatic lesions or intrahepatic biliary dilatation. The pancreaticobiliary tree appears normal.  Cirrhosis with portal venous hypertension and large portal venous collaterals and esophageal varices. Moderate ascites.   Electronically Signed   By: Marijo Sanes M.D.   On: 05/04/2015 21:24    Scheduled Meds: . albuterol  2.5 mg Nebulization Q4H  . antiseptic oral rinse  7 mL Mouth Rinse q12n4p  . antiseptic oral rinse  7 mL Mouth Rinse QID  . chlorhexidine  15 mL Mouth Rinse BID  . chlorhexidine gluconate  15 mL Mouth Rinse BID  . desvenlafaxine  50 mg Oral Daily  . docusate sodium  100 mg Oral Daily  . fluticasone  2 spray Each Nare Daily  . folic acid  1 mg Oral Daily  . insulin aspart  0-9 Units Subcutaneous TID WC  . lactulose  300 mL Rectal BID  . magic mouthwash  15 mL Oral QID   And  . lidocaine  15 mL Mouth/Throat QID  . magnesium oxide  400 mg Oral Daily  . methylPREDNISolone (SOLU-MEDROL) injection  40 mg Intravenous Q6H  . metoprolol tartrate   12.5 mg Oral BID  . pantoprazole (PROTONIX) IV  80 mg Intravenous Daily  . piperacillin-tazobactam (ZOSYN)  IV  3.375 g Intravenous Q8H  . sodium chloride  3 mL Intravenous Q12H  . tamsulosin  0.4 mg Oral Daily  . vitamin B-12  1,000 mcg Oral Daily   Continuous Infusions:   Assessment and plan:  Principal Problem:   Liver failure Active Problems:   Varices, esophageal   Varices, gastric   Alcoholic cirrhosis of liver   Hyperbilirubinemia   Elevated LFTs   Coagulopathy   Acute respiratory failure with hypercapnia   Aspiration pneumonia   Ulcerative colitis   Throat cancer   Ascites   Odynophagia   Abdominal pain, generalized   Encephalopathy, hepatic   CAD (coronary artery disease)   Essential hypertension   Obesity   Diabetes mellitus without complication   COPD (chronic obstructive pulmonary disease)   Thrombocytopenia   Splenomegaly   Other pancytopenia   Diastolic dysfunction   Pulmonary hypertension    1. Acute respiratory failure with hypoxia and hypercapnia secondary to aspiration pneumonia; complicated by encephalopathy. Patient became encephalopathic on 9/17. His ABG was consistent with respiratory acidosis/hypercapnia/and hypoxia. He was started on BiPAP, but with little improvement. Dr. Luan Pulling was consulted. Follow-up ABG was not improved. Patient's chest x-ray revealed a new right lower lobe pneumonia, likely from aspiration. -Dr. Luan Pulling recommended intubation for airway protection and treatment of respiratory failure. -Zosyn was started. Albuterol nebulizer was started. IV Solu-Medrol was started for his underlying COPD. -Continue treatment as started.  Acute encephalopathy, multifactorial including hypercapnea, hepatic encephalopathy, and possibly opiate induced. The patient was apparently given a small dose of morphine superimposed on chronic MS Contin. Due to his decompensated  cirrhosis, he likely had decreased clearance. His ammonia level has also  increased which is contrary beating. -Patient is now intubated which will hopefully treat his respiratory acidosis. -Lactulose has been ordered to be given as an enema, but can be transitioned to per tube. -MS Contin was discontinued.  Decompensated cirrhosis/liver failure-associated splenomegaly, perigastric and periesophageal varices, portal hypertension, pancytopenia, coagulopathy, elevated LFTs, and hyperbilirubinemia. -GI  has been consulted and is following the patient. -Presumed etiology alcohol induced although the patient is been abstinent for 10 years per history. -Right heart failure was considered, but the patient's echo reveals normal right heart systolic function. -Hepatitis A total antibody was positive, but the remainder of the hepatitis serology panel was negative. -MRCP revealed cirrhosis with portal venous hypertension and large portal venous collaterals, esophageal varices, moderate ascites, but no obvious hepatic lesions or intrahepatic biliary dilatation. -Thiopurine metabolites pending. -Patient was given vitamin K 2 per GI. Protonix drip started per GI. -Lactulose started and will be continued. Ammonia levels will be monitored daily. -We'll monitor for need for paracentesis. Will ask GI to opine.  History of throat cancer with resultant odynophagia and dysphagia. Likely the etiology of aspiration pneumonia. Patient was evaluated by the speech therapist and MBS. She recommended a dysphagia 2 diet with nectar thickened liquids. -Magic mouthwash with lidocaine was ordered for symptomatic treatment. -Patient is currently nothing by mouth secondary to intubation.   Grade 2 diastolic dysfunction, moderate aortic stenosis, pulmonary hypertension, per 2-D echocardiogram. Patient's EF was noted to be within normal limits at 60-65%. Right ventricular systolic function was apparently normal. -We'll change beta blocker to IV or per tube. -Will treat supportively.  Type 2  diabetes mellitus. Currently controlled on sliding scale NovoLog. We'll add Lantus in anticipation that his glucose will increase on steroids.  Time spent: 50 minutes critical care time.    [facility] Pager [phone]. If 7PM-7AM, please contact night-coverage at [email or web address], password [facility] 05/05/2015, 9:05 AM  LOS: 2 days

## 2015-05-05 NOTE — Progress Notes (Signed)
Notified patient sister Bing Neighbors) of transfer to ICU. Patient's questions answered.  Told to call back if any questions of concerns.

## 2015-05-05 NOTE — Progress Notes (Signed)
Consult requested by: Dr. Caryn Section Consult requested for respiratory failure:  HPI: This is a 54 year old who came to the emergency department with what appears to have been decompensated cirrhosis. He was initially improving and then early this morning was found to be much less responsive. He was given Narcan and improved but was brought to the ICU and started on BiPAP. He is more alert but agitated now. Blood gas and chest x-ray are pending. 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 He is now awake confused and agitated he is still on BiPAP. He also has a history of sleep apnea and according to the chart does not use CPAP.  Past Medical History  Diagnosis Date  . CHF (congestive heart failure)   . CAD (coronary artery disease)   . Hypertension   . Anemia   . Hyperlipidemia   . Sleep apnea     does not use CPAP  . Obesity   . Diabetes mellitus without complication     patient denies since weight loss from throat cancer treatment  . Ulcerative colitis     Diagnosed in late 58s. Dr. West Carbo before retired, last seen in 2013  . Dyspnea   . Cough   . Back pain   . COPD (chronic obstructive pulmonary disease)   . Erectile dysfunction   . Skin cancer   . Anemia of other chronic disease 07/28/2013  . Throat cancer     XRT, followed at [facility]  . Ascites   . Gastric varices   . Varices, esophageal   . Cirrhosis   . Diastolic dysfunction 9/62/2297    Grade 2. EF 60%  . Pulmonary hypertension 05/04/2015     55 mm Hg      Family History  Problem Relation Age of Onset  . Heart attack Father   . Diabetes Brother   . Heart attack Brother   . Emphysema Maternal Grandfather   . Colon cancer Neg Hx   . Liver disease Other     "I'm sure it does", lot of etoh abuse in family     Social History   Social History  . Marital Status: Divorced    Spouse Name: N/A  . Number of Children: 0  . Years of Education: N/A   Social History Main Topics  . Smoking status: Former Smoker -- 1.00 packs/day    Types: Cigarettes  . Smokeless tobacco: Never Used  . Alcohol Use: No     Comment: last etoh 2007. heavy before.   . Drug Use: No  . Sexual Activity: Not Asked   Other Topics Concern  . None   Social History Narrative     ROS: Unobtainable    Objective: Vital signs in last 24 hours: Temp:  [97.8 F (36.6 C)-98.1 F (36.7 C)] 97.8 F (36.6 C) (09/16 2109) Pulse Rate:  [78-104] 95 (09/17 0600) Resp:  [14-22] 15 (09/17 0600) BP: (102-131)/(45-63) 116/61 mmHg (09/17 0600) SpO2:  [92 %-100 %] 99 % (09/17 0600) Weight:  [105.235 kg (232 lb)] 105.235 kg (232 lb) (09/16 1934)  Weight change:  Last BM Date: 04/28/2015  Intake/Output from previous day: 09/16 0701 - 09/17 0700 In: 480 [P.O.:480] Out: 350 [Urine:350]  PHYSICAL EXAM He is awake alert confused and agitated. He is on BiPAP. His HEENT examination shows his pupils are reactive mucous membranes are slightly dry nose and throat are clear his neck is supple without masses. His chest shows significant bilateral rhonchi. His heart is regular without gallop. His abdomen is soft obese no masses I can't tell for sure about a fluid wave. He has trace edema of the extremities. Central nervous system examination shows the agitation and confusion  Lab Results: Basic Metabolic Panel:  Recent Labs  05/04/15 0618 05/05/15 0354  NA 132* 129*  K 4.2 4.5  CL 99* 96*  CO2 29 28  GLUCOSE 126* 151*  BUN 16 17  CREATININE 0.52* 0.64   CALCIUM 7.4* 7.8*   Liver Function Tests:  Recent Labs  05/04/15 0618 05/05/15 0354  AST 75* 78*  ALT 64* 69*  ALKPHOS 113 125  BILITOT 9.4* 12.3*  PROT 5.2* 6.2*  ALBUMIN 1.9* 2.2*   No results for input(s): LIPASE, AMYLASE in the last 72 hours.  Recent Labs  05/05/15 0354  AMMONIA 111*   CBC:  Recent Labs  05/11/2015 0430 05/04/15 0618 05/05/15 0354  WBC 3.3* 3.3* 5.8  NEUTROABS 1.8  --   --   HGB 9.1* 8.5* 9.6*  HCT 25.0* 24.0* 26.8*  MCV 121.4* 121.8* 121.3*  PLT 58* 43* 55*   Cardiac Enzymes:  Recent Labs  05/16/2015 0430  TROPONINI <0.03   BNP: No results for input(s): PROBNP in the last 72 hours. D-Dimer: No results for input(s): DDIMER in the last 72 hours. CBG:  Recent Labs  05/04/15 0745 05/04/15 1113 05/04/15 1708 05/04/15 2115 05/05/15 0339 05/05/15 0719  GLUCAP 114* 156* 159* 130* 129* 148*   Hemoglobin A1C: No results for input(s): HGBA1C in the last 72 hours. Fasting Lipid Panel: No results for input(s): CHOL, HDL, LDLCALC, TRIG, CHOLHDL, LDLDIRECT in the last 72 hours. Thyroid Function Tests:  Recent Labs  05/13/2015 0430  TSH 1.679   Anemia Panel:  Recent Labs  04/29/2015 1416  VITAMINB12 3716*   Coagulation:  Recent Labs  05/04/15 0618 05/05/15 0354  LABPROT 25.8* 23.8*  INR 2.39* 2.15*   Urine Drug Screen: Drugs of Abuse  No results found for: LABOPIA, COCAINSCRNUR, LABBENZ, AMPHETMU, THCU, LABBARB  Alcohol Level: No results for input(s): ETH in the last 72 hours. Urinalysis:  Recent Labs  04/22/2015 0425  COLORURINE ORANGE*  LABSPEC 1.025  PHURINE 6.5  GLUCOSEU 100*  HGBUR TRACE*  BILIRUBINUR LARGE*  KETONESUR TRACE*  PROTEINUR 30*  UROBILINOGEN >8.0*  NITRITE POSITIVE*  LEUKOCYTESUR NEGATIVE   Misc. Labs:   ABGS:  Recent Labs  05/05/15 0328  PHART 7.229*  PO2ART 69.3*  TCO2 11.5  HCO3 23.2     MICROBIOLOGY: Recent Results (from the past 240 hour(s))  Urine culture     Status: None  (Preliminary result)   Collection Time: 04/21/2015  4:37 AM  Result Value Ref Range Status   Specimen Description URINE, CLEAN CATCH  Final   Special Requests NONE  Final   Culture   Final    TOO YOUNG TO READ Performed at [facility]    Report Status PENDING  Incomplete    Studies/Results: Mr 3d Recon At Scanner  05/04/2015   CLINICAL DATA:  Cirrhosis.  Elevated liver function studies.  EXAM: MRI ABDOMEN WITHOUT  AND WITH CONTRAST (INCLUDING MRCP)  TECHNIQUE: Multiplanar multisequence MR imaging of the abdomen was performed both before and after the administration of intravenous contrast. Heavily T2-weighted images of the biliary and pancreatic ducts were obtained, and three-dimensional MRCP images were rendered by post processing.  CONTRAST:  10 cc MultiHance  COMPARISON:  CT scan 05/10/2015  FINDINGS: Examination is severely limited by breathing motion artifact.  There is a left pleural effusion and overlying atelectasis. No pericardial effusion.  There are cirrhotic changes involving the liver but no focal hepatic lesions are identified. No biliary dilatation. Mild splenomegaly is noted. There are extensive portal venous collaterals and esophageal varices. A splenorenal shunt is noted. Suspect cavernous transformation of the portal vein. The pancreas is grossly normal. No ductal dilatation, inflammation or mass.  The stomach, duodenum, small bowel and colon are grossly normal. No mesenteric or retroperitoneal mass or adenopathy. The aorta and branch vessels are patent.  Moderate ascites.  IMPRESSION: Very limited examination due to respiratory motion.  No obvious hepatic lesions or intrahepatic biliary dilatation. The pancreaticobiliary tree appears normal.  Cirrhosis with portal venous hypertension and large portal venous collaterals and esophageal varices. Moderate ascites.   Electronically Signed   By: Marijo Sanes M.D.   On: 05/04/2015 21:24   Dg Swallowing Func-speech  Pathology  05/04/2015    Objective Swallowing Evaluation:    Patient Details  Name: Antonio Stevens MRN: [id number] Date of Birth: [birth year]  Today's Date: 05/04/2015 Time: SLP Start Time (ACUTE ONLY): 1640-SLP Stop Time (ACUTE ONLY): 1659 SLP Time Calculation (min) (ACUTE ONLY): 19 min  Past Medical History:  Past Medical History  Diagnosis Date  . CHF (congestive heart failure)   . CAD (coronary artery disease)   . Hypertension   . Anemia   . Hyperlipidemia   . Sleep apnea     does not use CPAP  . Obesity   . Diabetes mellitus without complication     patient denies since weight loss from throat cancer treatment  . Ulcerative colitis     Diagnosed in late 51s. Dr. West Carbo before retired, last seen in 2013  . Dyspnea   . Cough   . Back pain   . COPD (chronic obstructive pulmonary disease)   . Erectile dysfunction   . Skin cancer   . Anemia of other chronic disease 07/28/2013  . Throat cancer     XRT, followed at [facility]  . Ascites   . Gastric varices   . Varices, esophageal   . Cirrhosis    Past Surgical History:  Past Surgical History  Procedure Laterality Date  . Cholecystectomy  2007  . Coronary artery bypass graft  1995  . Angioplasty    . Coronary stent placement  07/2014  . Rotator cuff repair    . Tonsillectomy    . Nasal septum surgery    . Colonoscopy      Dr. West Carbo, after age 77  . Esophagogastroduodenoscopy      Dr. West Carbo  . Knee arthroscopy     HPI:  Other Pertinent Information: ARIEH BOGUE is a very pleasant 54 y.o.  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 Initial evaluation in the emergency department reveals jaundiced  patient with hypotension thrombocytopenia elevated INR. Patient states for  the last  2 weeks he's experienced progressive  worsening generalized  weakness associated with poor appetite. Yesterday he developed epistaxis  nausea and vomiting with 2 episodes of emesis  No Data Recorded  Assessment / Plan / Recommendation CHL IP CLINICAL IMPRESSIONS 05/04/2015  Therapy Diagnosis Moderate oral phase dysphagia;Moderate pharyngeal phase  dysphagia  Clinical Impression PT presents with moderate oropharyngeal dysphagia  suspect secondary to previous radiation to larynx. Delay in swallow  evidenced with all consistencies. Thin liquid consistencies by teaspoon  yielded deep penetration to the vocal cords that cleared following the  swallow. Aspiration noted with thin liquids by cup which was not  consistently sensed by the pt. Chin tuck strategy also not effective in  preventing aspiration. Pt with better protection of airway with nectar  thick liquids when given small sips. Larger sips resulted in penetration  to the vocal cords, however no aspiration. Vallecular residuals present  with all PO secondary to poor base of tongue retraction. Penetration of  residuals noted after the swallow throughout MBS.  Suspect across a meal  residuals would become aspirated. Honey thick liquid trials resulted in  increased vallecular residuals. Pt with difficulty passing bairum tablet  throughout pharynx and esophagus. Pill remained in valleculae up to 2  minutes despite liquid wash down and was also static in esophagus 30  seconds before passing.  Pt reports difficulty with solid PO. Recommend  dysphagia 2 (chopped) and nectar thick liquids. Medicines crushed with  puree. Full supervision to reinforce safe swallow strategies. ST to follow  up.         CHL IP TREATMENT RECOMMENDATION 05/04/2015  Treatment Recommendations Therapy as outlined in treatment plan below     CHL IP DIET RECOMMENDATION 05/04/2015  SLP Diet Recommendations Dysphagia 2 (Fine chop);Nectar  Liquid Administration via (None)  Medication Administration Crushed with puree  Compensations Slow  rate;Small sips/bites;Multiple dry swallows after each  bite/sip  Postural Changes and/or Swallow Maneuvers (None)     CHL IP OTHER RECOMMENDATIONS 05/04/2015  Recommended Consults (None)  Oral Care Recommendations Oral care BID  Other Recommendations Order thickener from pharmacy     No flowsheet data found.   CHL IP FREQUENCY AND DURATION 05/04/2015  Speech Therapy Frequency (ACUTE ONLY) min 2x/week  Treatment Duration 1 week     Pertinent Vitals/Pain     SLP Swallow Goals No flowsheet data found.  No flowsheet data found.    CHL IP REASON FOR REFERRAL 05/04/2015  Reason for Referral Objectively evaluate swallowing function            No flowsheet data found.  CHL IP GO 05/04/2015  Functional Assessment Tool Used skilled observation of PO  Functional Limitations Swallowing  Swallow Current Status (S0109) CM  Swallow Goal Status (N2355) CL  Swallow Discharge Status (D3220) (None)  Motor Speech Current Status (U5427) (None)  Motor Speech Goal Status (C6237) (None)  Motor Speech Goal Status (S2831) (None)  Spoken Language Comprehension Current Status (D1761) (None)  Spoken Language Comprehension Goal Status (Y0737) (None)  Spoken Language Comprehension Discharge Status [phone]) (None)  Spoken Language Expression Current Status [phone]) (None)  Spoken Language Expression Goal Status [phone]) (None)  Spoken Language Expression Discharge Status [phone]) (None)  Attention Current Status (G1829) (None)  Attention Goal Status (H3716) (None)  Attention Discharge Status (R6789) (None)  Memory Current Status (F8101) (None)  Memory Goal Status (B5102) (None)  Memory Discharge Status (H8527) (None)  Voice Current Status (P8242) (None)  Voice Goal Status (P5361) (None)  Voice Discharge Status (  G9173) (None)  Other Speech-Language Pathology Functional Limitation [phone]) (None)  Other Speech-Language Pathology Functional Limitation Goal Status (Z6010)  (None)  Other Speech-Language Pathology Functional Limitation Discharge Status  [phone])  (None)          Arvil Chaco MA, CCC-SLP Acute Care Speech Language Pathologist    Levi Aland 05/04/2015, 5:20 PM    Mr Abd W/wo Cm/mrcp  05/04/2015   CLINICAL DATA:  Cirrhosis.  Elevated liver function studies.  EXAM: MRI ABDOMEN WITHOUT AND WITH CONTRAST (INCLUDING MRCP)  TECHNIQUE: Multiplanar multisequence MR imaging of the abdomen was performed both before and after the administration of intravenous contrast. Heavily T2-weighted images of the biliary and pancreatic ducts were obtained, and three-dimensional MRCP images were rendered by post processing.  CONTRAST:  10 cc MultiHance  COMPARISON:  CT scan 05/13/2015  FINDINGS: Examination is severely limited by breathing motion artifact.  There is a left pleural effusion and overlying atelectasis. No pericardial effusion.  There are cirrhotic changes involving the liver but no focal hepatic lesions are identified. No biliary dilatation. Mild splenomegaly is noted. There are extensive portal venous collaterals and esophageal varices. A splenorenal shunt is noted. Suspect cavernous transformation of the portal vein. The pancreas is grossly normal. No ductal dilatation, inflammation or mass.  The stomach, duodenum, small bowel and colon are grossly normal. No mesenteric or retroperitoneal mass or adenopathy. The aorta and branch vessels are patent.  Moderate ascites.  IMPRESSION: Very limited examination due to respiratory motion.  No obvious hepatic lesions or intrahepatic biliary dilatation. The pancreaticobiliary tree appears normal.  Cirrhosis with portal venous hypertension and large portal venous collaterals and esophageal varices. Moderate ascites.   Electronically Signed   By: Marijo Sanes M.D.   On: 05/04/2015 21:24    Medications:  Prior to Admission:  Prescriptions prior to admission  Medication Sig Dispense Refill Last Dose  . albuterol (PROVENTIL HFA;VENTOLIN HFA) 108 (90 BASE) MCG/ACT inhaler Inhale 2 puffs into the lungs every 6  (six) hours as needed for wheezing.   Past Week at Unknown time  . aspirin EC 81 MG tablet Take 81 mg by mouth daily.   05/02/2015 at Unknown time  . atorvastatin (LIPITOR) 40 MG tablet Take 40 mg by mouth daily.   05/02/2015 at Unknown time  . clopidogrel (PLAVIX) 75 MG tablet Take 75 mg by mouth daily.   05/02/2015 at Unknown time  . desvenlafaxine (PRISTIQ) 50 MG 24 hr tablet Take 50 mg by mouth daily.   05/02/2015 at Unknown time  . docusate sodium (COLACE) 100 MG capsule Take 100 mg by mouth daily as needed for mild constipation.    Past Week at Unknown time  . esomeprazole (NEXIUM) 40 MG capsule Take 40 mg by mouth daily before breakfast.   05/02/2015 at Unknown time  . fluticasone (FLONASE) 50 MCG/ACT nasal spray Place 2 sprays into the nose 2 (two) times daily as needed for rhinitis.   05/02/2015 at Unknown time  . folic acid (FOLVITE) 1 MG tablet Take 1 mg by mouth daily.   05/02/2015 at Unknown time  . ipratropium-albuterol (DUONEB) 0.5-2.5 (3) MG/3ML SOLN Inhale 3 mLs into the lungs daily as needed.   unknown  . magnesium oxide (MAG-OX) 400 MG tablet Take 400 mg by mouth daily.   Past Week at Unknown time  . mercaptopurine (PURINETHOL) 50 MG tablet Take 100 mg by mouth daily. Caution: Chemotherapy.   05/02/2015 at Unknown time  . metoprolol tartrate (LOPRESSOR) 25 MG tablet Take  12.5 mg by mouth 2 (two) times daily.    05/02/2015 at 2000  . morphine (MS CONTIN) 15 MG 12 hr tablet Take 1 tablet by mouth daily as needed for pain.    unknown  . nitroGLYCERIN (NITROSTAT) 0.4 MG SL tablet Place 0.4 mg under the tongue every 5 (five) minutes as needed for chest pain.   unknown  . potassium chloride (K-DUR) 10 MEQ tablet Take 20 mEq by mouth daily as needed (weakness).    Past Month at Unknown time  . PRESCRIPTION MEDICATION Take 5 mLs by mouth every 6 (six) hours as needed (throat pain). [place] mouthwash suspension.   05/02/2015 at Unknown time  . tadalafil (CIALIS) 5 MG tablet Take 5 mg by mouth  as needed for erectile dysfunction.   unknown  . tamsulosin (FLOMAX) 0.4 MG CAPS capsule Take 0.4 mg by mouth daily.    05/02/2015 at Unknown time  . vardenafil (LEVITRA) 20 MG tablet Take 20 mg by mouth as needed for erectile dysfunction.   unknown  . vitamin B-12 (CYANOCOBALAMIN) 1000 MCG tablet Take 1,000 mcg by mouth daily.   Past Week at Unknown time   Scheduled: . antiseptic oral rinse  7 mL Mouth Rinse q12n4p  . chlorhexidine  15 mL Mouth Rinse BID  . desvenlafaxine  50 mg Oral Daily  . docusate sodium  100 mg Oral Daily  . fluticasone  2 spray Each Nare Daily  . folic acid  1 mg Oral Daily  . insulin aspart  0-9 Units Subcutaneous TID WC  . lactulose  300 mL Rectal BID  . magic mouthwash  15 mL Oral QID   And  . lidocaine  15 mL Mouth/Throat QID  . magnesium oxide  400 mg Oral Daily  . metoprolol tartrate  12.5 mg Oral BID  . pantoprazole  80 mg Oral Q1200  . sodium chloride  3 mL Intravenous Q12H  . tamsulosin  0.4 mg Oral Daily  . vitamin B-12  1,000 mcg Oral Daily   Continuous:  EYC:XKGYJE chloride, food thickener, ipratropium-albuterol, naLOXone (NARCAN)  injection, nitroGLYCERIN, ondansetron **OR** ondansetron (ZOFRAN) IV, sodium chloride  Assesment: He was admitted with liver failure. He has a complicated situation with multiple other medical problems. He became more somnolent required Narcan and BiPAP. He has a lot of rhonchi on his chest examination I think it's a possibility that he aspirated while he was less responsive. He has a baseline problem with COPD. He has acute hypercapnic hypoxic respiratory failure which I think again is multifactorial. He is very confused probably from a combination of his respiratory failure and his liver failure. Ammonia level this morning is 111. Principal Problem:   Liver failure Active Problems:   CAD (coronary artery disease)   Essential hypertension   Obesity   Diabetes mellitus without complication   Ulcerative colitis   COPD  (chronic obstructive pulmonary disease)   Throat cancer   Thrombocytopenia   Varices, esophageal   Varices, gastric   Alcoholic cirrhosis of liver   Hyperbilirubinemia   Ascites   Splenomegaly   Elevated LFTs   Odynophagia   Abdominal pain, generalized   Other pancytopenia   Diastolic dysfunction   Pulmonary hypertension   Coagulopathy    Plan: At this point I would recheck chest x-ray to see if we see any signs of overt aspiration. I think we should go ahead and put him on antibiotics and steroids at least temporarily considering his chest congestion dramatic change in his respiratory  status and COPD    LOS: 2 days   HAWKINS,EDWARD L 05/05/2015, 7:35 AM

## 2015-05-05 NOTE — Progress Notes (Signed)
Patient ID: Antonio Stevens, male   DOB: November 02, 1960, 54 y.o.   MRN: [id number]   Assessment/Plan: ADMITTED WITH admitted with progressive weakness, difficulty eating, jaundice. Reason for hepatic decompensation not known. OVERNIGHT BECAME OBTUNDED(NA 129, AMMONIA 111, pCO2 58-67, pH 7.1-7.2) AND PT ASPIRATED. UNSUCCESSFUL ATTEMPT AT INTUBATION WHICH CREATED BLEEDING FROM TRAUMA IN SETTING OF PT WHO IS AUTO-ANTICAGULATED AND THROMBOCYTOPENIC. AMMONIA NOW UP TO 111. NO MELENA OR BRBPR. DOUBT PT HAS A VARICEAL BLEED.  PLAN: 1. CHANGE LACTULOSE TO ENEMAS Q6H. 2. SUPPORTIVE CARE 3. PT NOW WITH MSOF AND DNR. 4. NO INDICATION FOR ENDOSCOPY AT THIS TIME. ENDOSCOPY NOT EFFECTIVE WITH INR > 2.0. PT'S INR FAILED TO CORRECT AFTER VIT K WHICH  INDICATED ADVANCED LIVER DISEASE. 5. AVOID NG TUBE I PT WITH INR > 2 AND PLT CT 55 6. AGREE WITH FFP.  DISCUSSED CLINICAL CONDITION/POOR PROGNOSIS  WITH HIS BROTHER. HE VOICED HIS UNDERSTANDING.  Subjective: Since I last evaluated the patient HE IS NOW OBTUNDED ANDON BIPAP.   Objective: Vital signs in last 24 hours: Filed Vitals:   05/05/15 1520  BP: 122/62  Pulse: 101  Temp:   Resp: 19   General appearance: severe distress and toxic, WORKING HARD TO BREATH Resp: diminished breath sounds bibasilar Cardio: regular rate and rhythm GI: soft, non-tender; bowel sounds normal NEURO: OBTUNDED, UNABLE TO ACCESS DUE TO POOR MENTATION  Lab Results: Na 129 Cr 0.64 t bili 12.3 Hb 9.6 PLT 55 INR 2.15    Studies/Results: Mr 3d Recon At Scanner  05/04/2015   CLINICAL DATA:  Cirrhosis.  Elevated liver function studies.  EXAM: MRI ABDOMEN WITHOUT AND WITH CONTRAST (INCLUDING MRCP)  TECHNIQUE: Multiplanar multisequence MR imaging of the abdomen was performed both before and after the administration of intravenous contrast. Heavily T2-weighted images of the biliary and pancreatic ducts were obtained, and three-dimensional MRCP images were rendered by post processing.   CONTRAST:  10 cc MultiHance  COMPARISON:  CT scan 05/09/2015  FINDINGS: Examination is severely limited by breathing motion artifact.  There is a left pleural effusion and overlying atelectasis. No pericardial effusion.  There are cirrhotic changes involving the liver but no focal hepatic lesions are identified. No biliary dilatation. Mild splenomegaly is noted. There are extensive portal venous collaterals and esophageal varices. A splenorenal shunt is noted. Suspect cavernous transformation of the portal vein. The pancreas is grossly normal. No ductal dilatation, inflammation or mass.  The stomach, duodenum, small bowel and colon are grossly normal. No mesenteric or retroperitoneal mass or adenopathy. The aorta and branch vessels are patent.  Moderate ascites.  IMPRESSION: Very limited examination due to respiratory motion.  No obvious hepatic lesions or intrahepatic biliary dilatation. The pancreaticobiliary tree appears normal.  Cirrhosis with portal venous hypertension and large portal venous collaterals and esophageal varices. Moderate ascites.   Electronically Signed   By: Marijo Sanes M.D.   On: 05/04/2015 21:24   Dg Chest Port 1 View  05/05/2015   CLINICAL DATA:  Respiratory failure.  EXAM: PORTABLE CHEST - 1 VIEW  COMPARISON:  05/16/2015  FINDINGS: There is right lower lobe airspace disease. The lungs are otherwise clear. There is no pleural effusion or pneumothorax. There is stable cardiomegaly. There is evidence of prior median sternotomy. There is evidence of prior CABG.  The osseous structures are unremarkable.  IMPRESSION: Right lower lobe airspace disease most concerning for atelectasis versus pneumonia. Follow-up radiography is recommended.   Electronically Signed   By: Kathreen Devoid   On: 05/05/2015 08:52  Dg Swallowing Func-speech Pathology  05/04/2015    Objective Swallowing Evaluation:    Patient Details  Name: Antonio Stevens MRN: [id number] Date of Birth: Jul 15, 1961  Today's Date:  05/04/2015 Time: SLP Start Time (ACUTE ONLY): 1640-SLP Stop Time (ACUTE ONLY): 1659 SLP Time Calculation (min) (ACUTE ONLY): 19 min  Past Medical History:  Past Medical History  Diagnosis Date  . CHF (congestive heart failure)   . CAD (coronary artery disease)   . Hypertension   . Anemia   . Hyperlipidemia   . Sleep apnea     does not use CPAP  . Obesity   . Diabetes mellitus without complication     patient denies since weight loss from throat cancer treatment  . Ulcerative colitis     Diagnosed in late 8s. Dr. West Carbo before retired, last seen in 2013  . Dyspnea   . Cough   . Back pain   . COPD (chronic obstructive pulmonary disease)   . Erectile dysfunction   . Skin cancer   . Anemia of other chronic disease 07/28/2013  . Throat cancer     XRT, followed at [facility]  . Ascites   . Gastric varices   . Varices, esophageal   . Cirrhosis    Past Surgical History:  Past Surgical History  Procedure Laterality Date  . Cholecystectomy  2007  . Coronary artery bypass graft  1995  . Angioplasty    . Coronary stent placement  07/2014  . Rotator cuff repair    . Tonsillectomy    . Nasal septum surgery    . Colonoscopy      Dr. West Carbo, after age 2  . Esophagogastroduodenoscopy      Dr. West Carbo  . Knee arthroscopy     HPI:  Other Pertinent Information: [name] is a very pleasant 54 y.o.  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 Initial evaluation in the emergency department reveals jaundiced  patient with hypotension thrombocytopenia elevated INR. Patient states for  the last 2 weeks he's experienced progressive worsening generalized  weakness associated with poor appetite. Yesterday he developed epistaxis  nausea and vomiting with 2 episodes of emesis  No Data  Recorded  Assessment / Plan / Recommendation CHL IP CLINICAL IMPRESSIONS 05/04/2015  Therapy Diagnosis Moderate oral phase dysphagia;Moderate pharyngeal phase  dysphagia  Clinical Impression PT presents with moderate oropharyngeal dysphagia  suspect secondary to previous radiation to larynx. Delay in swallow  evidenced with all consistencies. Thin liquid consistencies by teaspoon  yielded deep penetration to the vocal cords that cleared following the  swallow. Aspiration noted with thin liquids by cup which was not  consistently sensed by the pt. Chin tuck strategy also not effective in  preventing aspiration. Pt with better protection of airway with nectar  thick liquids when given small sips. Larger sips resulted in penetration  to the vocal cords, however no aspiration. Vallecular residuals present  with all PO secondary to poor base of tongue retraction. Penetration of  residuals noted after the swallow throughout MBS.  Suspect across a meal  residuals would become aspirated. Honey thick liquid trials resulted in  increased vallecular residuals. Pt with difficulty passing bairum tablet  throughout pharynx and esophagus. Pill remained in valleculae up to 2  minutes despite liquid wash down  and was also static in esophagus 30  seconds before passing.  Pt reports difficulty with solid PO. Recommend  dysphagia 2 (chopped) and nectar thick liquids. Medicines crushed with  puree. Full supervision to reinforce safe swallow strategies. ST to follow  up.         CHL IP TREATMENT RECOMMENDATION 05/04/2015  Treatment Recommendations Therapy as outlined in treatment plan below     CHL IP DIET RECOMMENDATION 05/04/2015  SLP Diet Recommendations Dysphagia 2 (Fine chop);Nectar  Liquid Administration via (None)  Medication Administration Crushed with puree  Compensations Slow rate;Small sips/bites;Multiple dry swallows after each  bite/sip  Postural Changes and/or Swallow Maneuvers (None)     CHL IP OTHER RECOMMENDATIONS 05/04/2015   Recommended Consults (None)  Oral Care Recommendations Oral care BID  Other Recommendations Order thickener from pharmacy     No flowsheet data found.   CHL IP FREQUENCY AND DURATION 05/04/2015  Speech Therapy Frequency (ACUTE ONLY) min 2x/week  Treatment Duration 1 week     Pertinent Vitals/Pain     SLP Swallow Goals No flowsheet data found.  No flowsheet data found.    CHL IP REASON FOR REFERRAL 05/04/2015  Reason for Referral Objectively evaluate swallowing function            No flowsheet data found.  CHL IP GO 05/04/2015  Functional Assessment Tool Used skilled observation of PO  Functional Limitations Swallowing  Swallow Current Status (E7035) CM  Swallow Goal Status (K0938) CL  Swallow Discharge Status (H8299) (None)  Motor Speech Current Status (B7169) (None)  Motor Speech Goal Status (C7893) (None)  Motor Speech Goal Status (Y1017) (None)  Spoken Language Comprehension Current Status (P1025) (None)  Spoken Language Comprehension Goal Status (E5277) (None)  Spoken Language Comprehension Discharge Status [phone]) (None)  Spoken Language Expression Current Status [phone]) (None)  Spoken Language Expression Goal Status [phone]) (None)  Spoken Language Expression Discharge Status [phone]) (None)  Attention Current Status (Y1950) (None)  Attention Goal Status (D3267) (None)  Attention Discharge Status [phone]) (None)  Memory Current Status (K9983) (None)  Memory Goal Status (J8250) (None)  Memory Discharge Status (N3976) (None)  Voice Current Status (B3419) (None)  Voice Goal Status (F7902) (None)  Voice Discharge Status (I0973) (None)  Other Speech-Language Pathology Functional Limitation [phone]) (None)  Other Speech-Language Pathology Functional Limitation Goal Status (M4268)  (None)  Other Speech-Language Pathology Functional Limitation Discharge Status  [phone]) (None)          Arvil Chaco MA, CCC-SLP Acute Care Speech Language Pathologist    Levi Aland 05/04/2015, 5:20 PM    Mr Abd W/wo  Cm/mrcp  05/04/2015   CLINICAL DATA:  Cirrhosis.  Elevated liver function studies.  EXAM: MRI ABDOMEN WITHOUT AND WITH CONTRAST (INCLUDING MRCP)  TECHNIQUE: Multiplanar multisequence MR imaging of the abdomen was performed both before and after the administration of intravenous contrast. Heavily T2-weighted images of the biliary and pancreatic ducts were obtained, and three-dimensional MRCP images were rendered by post processing.  CONTRAST:  10 cc MultiHance  COMPARISON:  CT scan 05/09/2015  FINDINGS: Examination is severely limited by breathing motion artifact.  There is a left pleural effusion and overlying atelectasis. No pericardial effusion.  There are cirrhotic changes involving the liver but no focal hepatic lesions are identified. No biliary dilatation. Mild splenomegaly is noted. There are extensive portal venous collaterals and esophageal varices. A splenorenal shunt is noted. Suspect cavernous transformation of the portal vein. The pancreas is grossly normal. No ductal dilatation, inflammation or mass.  The stomach, duodenum, small bowel and colon are grossly normal. No mesenteric or retroperitoneal mass or adenopathy. The aorta and branch vessels are patent.  Moderate ascites.  IMPRESSION: Very limited examination due to respiratory motion.  No obvious hepatic lesions or intrahepatic biliary dilatation. The pancreaticobiliary tree appears normal.  Cirrhosis with portal venous hypertension and large portal venous collaterals and esophageal varices. Moderate ascites.   Electronically Signed   By: Marijo Sanes M.D.   On: 05/04/2015 21:24    Medications: I have reviewed the patient's current medications.   LOS: 5 days   Barney Drain 01/26/2014, 2:23 PM

## 2015-05-05 NOTE — Progress Notes (Signed)
Rounded on patient. Patient more somnolent and lethargic at this time. Patient was alert and oriented earlier in shift.  Took patient's vital signs, was oxygen was  66% on room air.  Placed patient on oxygen, had to titrate to 6l to get patient's oxygen up into 90s.  Other vital signs wnl. Patient responds to name, then goes right back to sleep.  Received an order for labs.  Labs done. Critical value called to Dr. Truman Hayward.  Received order to transfer patient to step down.

## 2015-05-05 NOTE — Progress Notes (Signed)
His x ray consistent with aspiration. I dont think he can protect his airway. Discussed with sister and she agrees to intubation afd mechanical ventilation.

## 2015-05-05 NOTE — Progress Notes (Signed)
1/2 amp narcan given via iv push. Pt aroused suddenly and violently flailing arms and legs. Pt left arm hit bed rail and opened up an old scab that was previously there. Arm cleaned and wrapped in gauze.

## 2015-05-05 NOTE — Progress Notes (Signed)
Peripherally Inserted Central Catheter/Midline Placement  The IV Nurse has discussed with the patient and/or persons authorized to consent for the patient, the purpose of this procedure and the potential benefits and risks involved with this procedure.  The benefits include less needle sticks, lab draws from the catheter and patient may be discharged home with the catheter.  Risks include, but not limited to, infection, bleeding, blood clot (thrombus formation), and puncture of an artery; nerve damage and irregular heat beat.  Alternatives to this procedure were also discussed.  PICC/Midline Placement Documentation  PICC Triple Lumen 05/10/29 PICC Left Basilic 47 cm 0 cm (Active)  Indication for Insertion or Continuance of Line Limited venous access - need for IV therapy >5 days (PICC only) 05/05/2015  6:00 PM  Exposed Catheter (cm) 0 cm 05/05/2015  6:00 PM  Site Assessment Clean;Intact;Dry 05/05/2015  6:00 PM  Lumen #1 Status Saline locked;Flushed;Blood return noted 05/05/2015  6:00 PM  Lumen #2 Status Flushed;Saline locked;Blood return noted 05/05/2015  6:00 PM  Lumen #3 Status Flushed;Saline locked;Blood return noted 05/05/2015  6:00 PM  Dressing Type Transparent;Securing device 05/05/2015  6:00 PM  Dressing Status Clean;Dry;Intact;Antimicrobial disc in place 05/05/2015  6:00 PM  Line Care Connections checked and tightened 05/05/2015  6:00 PM  Line Adjustment (NICU/IV Team Only) No 05/05/2015  6:00 PM  Dressing Change Due 05/12/15 05/05/2015  6:00 PM       Naaman Plummer 05/05/2015, 6:11 PM

## 2015-05-05 NOTE — Progress Notes (Signed)
Addendum:  The nurse anesthetist attempted to intubate the patient as ordered by pulmonologist Dr. Luan Pulling for airway protection and treatment of acute respiratory failure secondary to aspiration pneumonia. However in this patient with a history of throat cancer status post radiation, esophageal varices, coagulopathy, and thrombocytopenia, the attempted intubation was difficult and was apparently met with some resistance. Subsequently, the patient began bleeding although it wasn't clear if it was laryngeal bleeding or bleeding from esophageal varices. After 2 attempts by the nurse anesthetist, intubation was aborted. BiPAP was resumed. The patient's follow-up ABG worsened with a pH of 7.1, PCO2 of 67, and PO2 of 111. The patient remains encephalopathic and critically ill.  -1 unit of fresh frozen plasma and 1 unit of platelets will be ordered. Patient does not appear to have ongoing bleeding on BiPAP per my recent assessment.  I discussed the patient with gastroenterologist, Dr. Oneida Alar and informed her of the upper airway/possible upper GI bleeding following attempted intubation. She will follow-up with evaluation, but recommended consideration for transfer to [facility]. I discussed the patient with intensivist, Dr. Lamonte Sakai at [facility]. He was concerned that the patient was not stable enough to be transferred to [facility], although he was willing to accept the patient with adequate airway protection. He recommended readdressing CODE STATUS with the patient's family. I also discussed the patient with pulmonologist, Dr. Luan Pulling again. He was in agreement that [place] should be readdressed with the patient as the patient's prognosis is extremely poor.  I contacted the patient's sister Holley Raring and her brother BJ. I informed them of the patient's status, namely the difficult attempted intubation and the upper airway/upper GI bleeding that ensued. I informed them that if we tred to reintubate him again, it  would likely lead to catastrophic GI/upper airway hemorrhage. Also. I informed them of his decompensated liver failure and coagulopathy, thrombocytopenia, aspiration pneumonia, and valvular heart disease. With all considered, I gently recommended a DO NOT RESUSCITATE status. Both Glenda and BJ were in agreement with DO NOT RESUSCITATE status.  We'll continue supportive treatment with BiPAP, antibiotics, transfusion of FFP/platelets, Protonix drip, etc. But he is now a DNR.   Total clinical time in discussion with specialists and family: 42 minutes.

## 2015-05-05 NOTE — Progress Notes (Signed)
ANTIBIOTIC CONSULT NOTE - INITIAL  Pharmacy Consult for zosyn Indication: pneumonia  No Known Allergies  Patient Measurements: Height: 6' (182.9 cm) Weight: 232 lb (105.235 kg) IBW/kg (Calculated) : 77.6   Vital Signs: Temp: 97.8 F (36.6 C) (09/16 2109) Temp Source: Axillary (09/16 2109) BP: 116/61 mmHg (09/17 0600) Pulse Rate: 95 (09/17 0600) Intake/Output from previous day: 09/16 0701 - 09/17 0700 In: 480 [P.O.:480] Out: 350 [Urine:350] Intake/Output from this shift:    Labs:  Recent Labs  05/01/2015 0430 05/04/15 0618 05/05/15 0354  WBC 3.3* 3.3* 5.8  HGB 9.1* 8.5* 9.6*  PLT 58* 43* 55*  CREATININE 0.56* 0.52* 0.64   Estimated Creatinine Clearance: 130.7 mL/min (by C-G formula based on Cr of 0.64). No results for input(s): VANCOTROUGH, VANCOPEAK, VANCORANDOM, GENTTROUGH, GENTPEAK, GENTRANDOM, TOBRATROUGH, TOBRAPEAK, TOBRARND, AMIKACINPEAK, AMIKACINTROU, AMIKACIN in the last 72 hours.   Microbiology: Recent Results (from the past 720 hour(s))  Urine culture     Status: None (Preliminary result)   Collection Time: 05/18/2015  4:37 AM  Result Value Ref Range Status   Specimen Description URINE, CLEAN CATCH  Final   Special Requests NONE  Final   Culture   Final    TOO YOUNG TO READ Performed at [facility]    Report Status PENDING  Incomplete    Medical History: Past Medical History  Diagnosis Date  . CHF (congestive heart failure)   . CAD (coronary artery disease)   . Hypertension   . Anemia   . Hyperlipidemia   . Sleep apnea     does not use CPAP  . Obesity   . Diabetes mellitus without complication     patient denies since weight loss from throat cancer treatment  . Ulcerative colitis     Diagnosed in late 78s. Dr. West Carbo before retired, last seen in 2013  . Dyspnea   . Cough   . Back pain   . COPD (chronic obstructive pulmonary disease)   . Erectile dysfunction   . Skin cancer   . Anemia of other chronic disease 07/28/2013  .  Throat cancer     XRT, followed at [facility]  . Ascites   . Gastric varices   . Varices, esophageal   . Cirrhosis   . Diastolic dysfunction 0/16/0109    Grade 2. EF 60%  . Pulmonary hypertension 05/04/2015    55 mm Hg     Medications:  Prescriptions prior to admission  Medication Sig Dispense Refill Last Dose  . albuterol (PROVENTIL HFA;VENTOLIN HFA) 108 (90 BASE) MCG/ACT inhaler Inhale 2 puffs into the lungs every 6 (six) hours as needed for wheezing.   Past Week at Unknown time  . aspirin EC 81 MG tablet Take 81 mg by mouth daily.   05/02/2015 at Unknown time  . atorvastatin (LIPITOR) 40 MG tablet Take 40 mg by mouth daily.   05/02/2015 at Unknown time  . clopidogrel (PLAVIX) 75 MG tablet Take 75 mg by mouth daily.   05/02/2015 at Unknown time  . desvenlafaxine (PRISTIQ) 50 MG 24 hr tablet Take 50 mg by mouth daily.   05/02/2015 at Unknown time  . docusate sodium (COLACE) 100 MG capsule Take 100 mg by mouth daily as needed for mild constipation.    Past Week at Unknown time  . esomeprazole (NEXIUM) 40 MG capsule Take 40 mg by mouth daily before breakfast.   05/02/2015 at Unknown time  . fluticasone (FLONASE) 50 MCG/ACT nasal spray Place 2 sprays into the nose 2 (two)  times daily as needed for rhinitis.   05/02/2015 at Unknown time  . folic acid (FOLVITE) 1 MG tablet Take 1 mg by mouth daily.   05/02/2015 at Unknown time  . ipratropium-albuterol (DUONEB) 0.5-2.5 (3) MG/3ML SOLN Inhale 3 mLs into the lungs daily as needed.   unknown  . magnesium oxide (MAG-OX) 400 MG tablet Take 400 mg by mouth daily.   Past Week at Unknown time  . mercaptopurine (PURINETHOL) 50 MG tablet Take 100 mg by mouth daily. Caution: Chemotherapy.   05/02/2015 at Unknown time  . metoprolol tartrate (LOPRESSOR) 25 MG tablet Take 12.5 mg by mouth 2 (two) times daily.    05/02/2015 at 2000  . morphine (MS CONTIN) 15 MG 12 hr tablet Take 1 tablet by mouth daily as needed for pain.    unknown  . nitroGLYCERIN (NITROSTAT) 0.4 MG SL  tablet Place 0.4 mg under the tongue every 5 (five) minutes as needed for chest pain.   unknown  . potassium chloride (K-DUR) 10 MEQ tablet Take 20 mEq by mouth daily as needed (weakness).    Past Month at Unknown time  . PRESCRIPTION MEDICATION Take 5 mLs by mouth every 6 (six) hours as needed (throat pain). [place] mouthwash suspension.   05/02/2015 at Unknown time  . tadalafil (CIALIS) 5 MG tablet Take 5 mg by mouth as needed for erectile dysfunction.   unknown  . tamsulosin (FLOMAX) 0.4 MG CAPS capsule Take 0.4 mg by mouth daily.    05/02/2015 at Unknown time  . vardenafil (LEVITRA) 20 MG tablet Take 20 mg by mouth as needed for erectile dysfunction.   unknown  . vitamin B-12 (CYANOCOBALAMIN) 1000 MCG tablet Take 1,000 mcg by mouth daily.   Past Week at Unknown time   Assessment: 54 yo man to start zosyn for aspiration PNA.  He was admitted with liver failure and may have aspirated when he was less responsive.  Goal of Therapy:  Eradication of infection  Plan:  Zosyn 3.375 gm IV q8 hours F/u renal function, cultures and clinical course  Thanks for allowing pharmacy to be a part of this patient's care.  Excell Seltzer, PharmD Clinical Pharmacist 05/05/2015,8:11 AM

## 2015-05-06 ENCOUNTER — Inpatient Hospital Stay (HOSPITAL_COMMUNITY): Payer: Medicare PPO

## 2015-05-06 LAB — BASIC METABOLIC PANEL
Anion gap: 9 (ref 5–15)
BUN: 30 mg/dL — ABNORMAL HIGH (ref 6–20)
CHLORIDE: 99 mmol/L — AB (ref 101–111)
CO2: 25 mmol/L (ref 22–32)
Calcium: 8.2 mg/dL — ABNORMAL LOW (ref 8.9–10.3)
Creatinine, Ser: 1.07 mg/dL (ref 0.61–1.24)
GFR calc non Af Amer: >60 mL/min (ref >60)
Glucose, Bld: 86 mg/dL (ref 65–99)
POTASSIUM: 4.8 mmol/L (ref 3.5–5.1)
SODIUM: 133 mmol/L — AB (ref 135–145)

## 2015-05-06 LAB — HEPATIC FUNCTION PANEL
ALT: 54 U/L (ref 17–63)
AST: 67 U/L — ABNORMAL HIGH (ref 15–41)
Albumin: 2.2 g/dL — ABNORMAL LOW (ref 3.5–5.0)
Alkaline Phosphatase: 86 U/L (ref 38–126)
BILIRUBIN DIRECT: 6.4 mg/dL — AB (ref 0.1–0.5)
BILIRUBIN TOTAL: 14.4 mg/dL — AB (ref 0.3–1.2)
Indirect Bilirubin: 8 mg/dL — ABNORMAL HIGH (ref 0.3–0.9)
Total Protein: 5.5 g/dL — ABNORMAL LOW (ref 6.5–8.1)

## 2015-05-06 LAB — BLOOD GAS, ARTERIAL
ACID-BASE EXCESS: 0.7 mmol/L (ref 0.0–2.0)
Bicarbonate: 23.2 mEq/L (ref 20.0–24.0)
DELIVERY SYSTEMS: POSITIVE
DRAWN BY: 22223
Expiratory PAP: 6
FIO2: 75
INSPIRATORY PAP: 18
O2 Saturation: 94.9 %
Patient temperature: 37
TCO2: 14.9 mmol/L (ref 0–100)
pCO2 arterial: 50.3 mmHg — ABNORMAL HIGH (ref 35.0–45.0)
pH, Arterial: 7.311 — ABNORMAL LOW (ref 7.350–7.450)
pO2, Arterial: 81.3 mmHg (ref 80.0–100.0)

## 2015-05-06 LAB — GLUCOSE, CAPILLARY
GLUCOSE-CAPILLARY: 81 mg/dL (ref 65–99)
GLUCOSE-CAPILLARY: 108 mg/dL — AB (ref 65–99)
Glucose-Capillary: 101 mg/dL — ABNORMAL HIGH (ref 65–99)
Glucose-Capillary: 104 mg/dL — ABNORMAL HIGH (ref 65–99)

## 2015-05-06 LAB — CBC
HEMATOCRIT: 21.8 % — AB (ref 39.0–52.0)
Hemoglobin: 7.7 g/dL — ABNORMAL LOW (ref 13.0–17.0)
MCH: 43.5 pg — ABNORMAL HIGH (ref 26.0–34.0)
MCHC: 35.3 g/dL (ref 30.0–36.0)
MCV: 123.2 fL — AB (ref 78.0–100.0)
PLATELETS: 95 10*3/uL — AB (ref 150–400)
RBC: 1.77 MIL/uL — AB (ref 4.22–5.81)
RDW: 19.6 % — ABNORMAL HIGH (ref 11.5–15.5)
WBC: 4.4 10*3/uL (ref 4.0–10.5)

## 2015-05-06 LAB — PREPARE FRESH FROZEN PLASMA: UNIT DIVISION: 0

## 2015-05-06 LAB — PREPARE PLATELET PHERESIS: Unit division: 0

## 2015-05-06 LAB — PREPARE RBC (CROSSMATCH)

## 2015-05-06 LAB — MRSA PCR SCREENING: MRSA by PCR: NEGATIVE

## 2015-05-06 LAB — AMMONIA: Ammonia: 97 umol/L — ABNORMAL HIGH (ref 9–35)

## 2015-05-06 MED ORDER — FENTANYL CITRATE (PF) 100 MCG/2ML IJ SOLN: 12.5000 ug | INTRAMUSCULAR | Status: DC | PRN

## 2015-05-06 MED ORDER — SODIUM CHLORIDE 0.9 % IV BOLUS (SEPSIS)
250.0000 mL | Freq: Once | INTRAVENOUS | Status: AC
  Administered 2015-05-06: 250 mL via INTRAVENOUS

## 2015-05-06 MED ORDER — THIAMINE HCL 100 MG/ML IJ SOLN
100.0000 mg | Freq: Every day | INTRAMUSCULAR | Status: DC
  Administered 2015-05-07: 100 mg via INTRAVENOUS
  Filled 2015-05-06: qty 2

## 2015-05-06 MED ORDER — LACTULOSE ENEMA
300.0000 mL | Freq: Four times a day (QID) | ORAL | Status: DC
  Administered 2015-05-06 (×3): 300 mL via RECTAL
  Filled 2015-05-06 (×6): qty 300

## 2015-05-06 MED ORDER — DEXTROSE-NACL 5-0.9 % IV SOLN
INTRAVENOUS | Status: DC
  Administered 2015-05-06 – 2015-05-07 (×2): via INTRAVENOUS

## 2015-05-06 MED ORDER — LACTULOSE ENEMA: 300.0000 mL | Freq: Three times a day (TID) | ORAL | Status: DC

## 2015-05-06 MED ORDER — SODIUM CHLORIDE 0.9 % IV BOLUS (SEPSIS)
500.0000 mL | Freq: Once | INTRAVENOUS | Status: AC
  Administered 2015-05-06: 500 mL via INTRAVENOUS

## 2015-05-06 MED ORDER — LACTULOSE 10 GM/15ML PO SOLN: 30.0000 g | Freq: Three times a day (TID) | ORAL | Status: DC

## 2015-05-06 MED ORDER — METOPROLOL TARTRATE 1 MG/ML IV SOLN
5.0000 mg | INTRAVENOUS | Status: DC | PRN
  Administered 2015-05-07: 5 mg via INTRAVENOUS
  Filled 2015-05-06: qty 5

## 2015-05-06 MED ORDER — SODIUM CHLORIDE 0.9 % IV SOLN
Freq: Once | INTRAVENOUS | Status: AC
  Administered 2015-05-06: 23:00:00 via INTRAVENOUS

## 2015-05-06 MED ORDER — LACTULOSE ENEMA
300.0000 mL | Freq: Three times a day (TID) | RECTAL | Status: DC
Start: 2015-05-07 — End: 2015-05-07
  Administered 2015-05-07: 300 mL via RECTAL
  Filled 2015-05-06 (×7): qty 300

## 2015-05-06 MED ORDER — FUROSEMIDE 10 MG/ML IJ SOLN: 10.0000 mg | Freq: Once | INTRAMUSCULAR | Status: DC

## 2015-05-06 NOTE — Progress Notes (Addendum)
Patient's BP dropped to 81/32, o2 sat dropped to low 80's. Notified MD.  New orders received and carried out.  MD changed ice chips to thickened water.  Encouraged family to limit the amount of thickened water patient was taking in.

## 2015-05-06 NOTE — Progress Notes (Signed)
Pt continually combative, Continually removing BiPAP even with mitts on. Pt is at times sedate for 1-2 minutes then continues with flailing and removing/attempting to remove devices. Pt is incoherent despite RN sitting in the room and continually attempting to reorient patient with no success

## 2015-05-06 NOTE — Progress Notes (Signed)
RT note-placed back to bipap, eating ice, sp02 84%, treatment given a this time as well.

## 2015-05-06 NOTE — Progress Notes (Addendum)
Patient ID: Antonio Stevens, male   DOB: 1960/12/30, 54 y.o.   MRN: [id number]   Assessment/Plan: ADMITTED WITH HEPATIC DECOMPENSATION COMPLICATED BY MS CHANGES/TRAUMATIC ATTEMPT AT INTUBATION DUE TO HYPERCARBIC.HYPOXIC RESPIRATORY FAILURE. PROGNOSIS REMAINS GUARDED BY CLINICALLY IMPROVED AND OFF OF BIPAP.  PLAN: 1. PT MAY HAVE ICE CHIPS AS TOLERATED 2. CONTINUE LACTULOSE ENEMAS 3. CONTINUE TO MONITOR SYMPTOMS.  GREATER THAN 50% WAS SPENT IN COUNSELING & COORDINATION OF CARE WITH THE PATIENT'S FAMILY: DISCUSSED DIFFERENTIAL DIAGNOSIS AND MANAGEMENT OF MSOF/CIRRHOSIS AND HOW IT COMPLICATES HIS MANAGEMENT TOTAL ENCOUNTER TIME: 25 MINS.     Subjective: Since I last evaluated the patient  HE IS OFF BIPAP. STILL CONFUSED. MAKING EYE CONTACT. BREATHING EASIER.NO BRBPR OR MELENA.  Objective: Vital signs in last 24 hours: Filed Vitals:   05/06/15 1000  BP: 85/32  Pulse: 110  Temp:   Resp: 25     General appearance: alert, mild distress and slowed mentation Resp: COARSE BREATH SOUND BILATERALLY WITH END EXPIRATORY WHEEZE IN BOTH LUNG Antonio Stevens Cardio: regular rate and rhythm GI: soft, non-tender; bowel sounds normal;   Lab Results:   AMMONIA 94, T BILI 14.4 DIRECT BILI 6.4 ALBUMIN 2.2  Studies/Results: Dg Chest Port 1 View  05/06/2015   CLINICAL DATA:  Follow-up infiltrates  EXAM: PORTABLE CHEST - 1 VIEW  COMPARISON:  9/17/ 16  FINDINGS: Cardiomediastinal silhouette is stable. Status post CABG. Persistent streaky right basilar atelectasis or infiltrate. Worsening left perihilar infiltrate. Small left pleural effusion with left basilar atelectasis or infiltrate. No convincing pulmonary edema.  IMPRESSION: Status post CABG. Persistent streaky right basilar atelectasis or infiltrate. Worsening left perihilar infiltrate. Small left pleural effusion with left basilar atelectasis or infiltrate. No convincing pulmonary edema.   Electronically Signed   By: Antonio Stevens M.D.   On: 05/06/2015 09:31    Dg Chest Port 1 View  05/05/2015   CLINICAL DATA:  Evaluate central line placement. History of CHF, diabetes, hypertension and coronary artery disease.  EXAM: PORTABLE CHEST - 1 VIEW  COMPARISON:  05/05/2015 at 8:16 a.m.  FINDINGS: Left PICC tip projects at the caval atrial junction.  Right lower lung zone consolidation is less apparent than on the earlier study, likely due to the erect position on the current exam the supine positioning previously, as well as different rotation. Left base is not well evaluated on the current exam. Stable cardiomegaly and changes from previous CABG surgery.  IMPRESSION: Left PICC is well positioned with its tip at the caval atrial junction.   Electronically Signed   By: Antonio Stevens M.D.   On: 05/05/2015 18:30    Medications: I have reviewed the patient's current medications.   LOS: 5 days   Antonio Stevens 01/26/2014, 2:23 PM

## 2015-05-06 NOTE — Progress Notes (Signed)
Subjective: Events of yesterday noted. I agree with plans for DO NOT RESUSCITATE status considering his multiple medical illnesses. He is very confused today.  Objective: Vital signs in last 24 hours: Temp:  [97.4 F (36.3 C)-98.6 F (37 C)] 98.6 F (37 C) (09/18 0400) Pulse Rate:  [73-160] 110 (09/18 0600) Resp:  [13-28] 21 (09/18 0600) BP: (93-143)/(34-125) 103/39 mmHg (09/18 0600) SpO2:  [87 %-100 %] 96 % (09/18 0600) FiO2 (%):  [30 %-75 %] 30 % (09/18 0527) Weight:  [105.7 kg (233 lb 0.4 oz)] 105.7 kg (233 lb 0.4 oz) (09/18 0500) Weight change:  Last BM Date: 04/29/2015  Intake/Output from previous day: 09/17 0701 - 09/18 0700 In: 2079.5 [I.V.:112; Blood:767.5] Out: 2150 [Urine:250; Stool:1900]  PHYSICAL EXAM General appearance: alert, mild distress and Agitated Resp: rhonchi bilaterally Cardio: regular rate and rhythm, S1, S2 normal, no murmur, click, rub or gallop GI: Distended with questionable ascites Extremities: He has some edema  Lab Results:  Results for orders placed or performed during the hospital encounter of 04/23/2015 (from the past 48 hour(s))  Glucose, capillary     Status: Abnormal   Collection Time: 05/04/15  7:45 AM  Result Value Ref Range   Glucose-Capillary 114 (H) 65 - 99 mg/dL  Glucose, capillary     Status: Abnormal   Collection Time: 05/04/15 11:13 AM  Result Value Ref Range   Glucose-Capillary 156 (H) 65 - 99 mg/dL  Glucose, capillary     Status: Abnormal   Collection Time: 05/04/15  5:08 PM  Result Value Ref Range   Glucose-Capillary 159 (H) 65 - 99 mg/dL   Comment 1 Notify RN    Comment 2 Document in Chart   Glucose, capillary     Status: Abnormal   Collection Time: 05/04/15  9:15 PM  Result Value Ref Range   Glucose-Capillary 130 (H) 65 - 99 mg/dL   Comment 1 Notify RN   Blood gas, arterial     Status: Abnormal   Collection Time: 05/05/15  3:28 AM  Result Value Ref Range   O2 Content 6.0 L/min   Delivery systems NASAL CANNULA     pH, Arterial 7.229 (L) 7.350 - 7.450   pCO2 arterial 65.8 (HH) 35.0 - 45.0 mmHg    Comment: CRITICAL RESULT CALLED TO, READ BACK BY AND VERIFIED WITH:  LONDRA FINK RN BY K KNICK RRT RCP ON 05/05/2015 [id number]    pO2, Arterial 69.3 (L) 80.0 - 100.0 mmHg   Bicarbonate 23.2 20.0 - 24.0 mEq/L   TCO2 11.5 0 - 100 mmol/L   Acid-Base Excess 0.3 0.0 - 2.0 mmol/L   O2 Saturation 89.0 %   Patient temperature 37.0    Collection site RIGHT RADIAL    Drawn by [id number]    Sample type ARTERIAL    Allens test (pass/fail) PASS PASS  Glucose, capillary     Status: Abnormal   Collection Time: 05/05/15  3:39 AM  Result Value Ref Range   Glucose-Capillary 129 (H) 65 - 99 mg/dL  CBC     Status: Abnormal   Collection Time: 05/05/15  3:54 AM  Result Value Ref Range   WBC 5.8 4.0 - 10.5 K/uL   RBC 2.21 (L) 4.22 - 5.81 MIL/uL   Hemoglobin 9.6 (L) 13.0 - 17.0 g/dL   HCT 08.9 (L) 15.6 - 49.7 %   MCV 121.3 (H) 78.0 - 100.0 fL   MCH 43.4 (H) 26.0 - 34.0 pg   MCHC 35.8 30.0 - 36.0 g/dL  RDW 19.7 (H) 11.5 - 15.5 %   Platelets 55 (L) 150 - 400 K/uL    Comment: SPECIMEN CHECKED FOR CLOTS  Protime-INR     Status: Abnormal   Collection Time: 05/05/15  3:54 AM  Result Value Ref Range   Prothrombin Time 23.8 (H) 11.6 - 15.2 seconds   INR 2.15 (H) 0.00 - 1.49  Comprehensive metabolic panel     Status: Abnormal   Collection Time: 05/05/15  3:54 AM  Result Value Ref Range   Sodium 129 (L) 135 - 145 mmol/L   Potassium 4.5 3.5 - 5.1 mmol/L   Chloride 96 (L) 101 - 111 mmol/L   CO2 28 22 - 32 mmol/L   Glucose, Bld 151 (H) 65 - 99 mg/dL   BUN 17 6 - 20 mg/dL   Creatinine, Ser 0.64 0.61 - 1.24 mg/dL   Calcium 7.8 (L) 8.9 - 10.3 mg/dL   Total Protein 6.2 (L) 6.5 - 8.1 g/dL   Albumin 2.2 (L) 3.5 - 5.0 g/dL   AST 78 (H) 15 - 41 U/L   ALT 69 (H) 17 - 63 U/L   Alkaline Phosphatase 125 38 - 126 U/L   Total Bilirubin 12.3 (H) 0.3 - 1.2 mg/dL   GFR calc non Af Amer >60 >60 mL/min   GFR calc Af Amer >60 >60 mL/min     Comment: (NOTE) The eGFR has been calculated using the CKD EPI equation. This calculation has not been validated in all clinical situations. eGFR's persistently <60 mL/min signify possible Chronic Kidney Disease.    Anion gap 5 5 - 15  Ammonia     Status: Abnormal   Collection Time: 05/05/15  3:54 AM  Result Value Ref Range   Ammonia 111 (H) 9 - 35 umol/L  MRSA PCR Screening     Status: None   Collection Time: 05/05/15  4:10 AM  Result Value Ref Range   MRSA by PCR NEGATIVE NEGATIVE    Comment:        The GeneXpert MRSA Assay (FDA approved for NASAL specimens only), is one component of a comprehensive MRSA colonization surveillance program. It is not intended to diagnose MRSA infection nor to guide or monitor treatment for MRSA infections.   Glucose, capillary     Status: Abnormal   Collection Time: 05/05/15  7:19 AM  Result Value Ref Range   Glucose-Capillary 148 (H) 65 - 99 mg/dL   Comment 1 Notify RN    Comment 2 Document in Chart   Blood gas, arterial     Status: Abnormal   Collection Time: 05/05/15  7:46 AM  Result Value Ref Range   FIO2 55.00    O2 Content 55.0 L/min   Delivery systems BILEVEL POSITIVE AIRWAY PRESSURE    Mode BILEVEL POSITIVE AIRWAY PRESSURE    Inspiratory PAP 16    Expiratory PAP 5    pH, Arterial 7.229 (L) 7.350 - 7.450   pCO2 arterial 58.8 (HH) 35.0 - 45.0 mmHg    Comment: CRITICAL RESULT CALLED TO, READ BACK BY AND VERIFIED WITH: MICHEAL GRAY,RN ON 05/04/2016 BY PEVIANY LAWSON,RRT AT 5397.    pO2, Arterial 68.8 (L) 80.0 - 100.0 mmHg   Bicarbonate 21.2 20.0 - 24.0 mEq/L   Acid-base deficit 2.9 (H) 0.0 - 2.0 mmol/L   O2 Saturation 89.2 %   Collection site RIGHT RADIAL    Drawn by [phone]    Sample type ARTERIAL    Allens test (pass/fail) PASS PASS  Blood gas, arterial  Status: Abnormal   Collection Time: 05/05/15 10:52 AM  Result Value Ref Range   FIO2 0.75    O2 Content 75.0 L/min   Delivery systems BILEVEL POSITIVE AIRWAY PRESSURE     Mode BILEVEL POSITIVE AIRWAY PRESSURE    Inspiratory PAP 18    Expiratory PAP 6    pH, Arterial 7.155 (LL) 7.350 - 7.450    Comment: CRITICAL RESULT CALLED TO, READ BACK BY AND VERIFIED WITH: MICHEAL GRAY,RN ON 05/04/2016 BY PEVIANY LAWSON,RRT AT 1058.    pCO2 arterial 67.3 (HH) 35.0 - 45.0 mmHg    Comment: CRITICAL RESULT CALLED TO, READ BACK BY AND VERIFIED WITH: MICHEAL GRAY,RN ON 05/05/2015 BY PEVIANY LAWSON,RRT AT 1058.    pO2, Arterial 111 (H) 80.0 - 100.0 mmHg   Bicarbonate 19.4 (L) 20.0 - 24.0 mEq/L   Acid-base deficit 4.8 (H) 0.0 - 2.0 mmol/L   O2 Saturation 96.5 %   Collection site RIGHT RADIAL    Drawn by [phone]    Sample type ARTERIAL    Allens test (pass/fail) PASS PASS  Glucose, capillary     Status: Abnormal   Collection Time: 05/05/15 11:28 AM  Result Value Ref Range   Glucose-Capillary 142 (H) 65 - 99 mg/dL   Comment 1 Document in Chart   Type and screen     Status: None   Collection Time: 05/05/15 11:40 AM  Result Value Ref Range   ABO/RH(D) O POS    Antibody Screen NEG    Sample Expiration 05/08/2015   Prepare fresh frozen plasma     Status: None   Collection Time: 05/05/15 11:40 AM  Result Value Ref Range   Unit Number [id number]    Blood Component Type THAWED PLASMA    Unit division 00    Status of Unit ISSUED,FINAL    Transfusion Status OK TO TRANSFUSE   Prepare Pheresed Platelets     Status: None   Collection Time: 05/05/15 11:40 AM  Result Value Ref Range   Unit Number [id number]    Blood Component Type PLTPHER LR1    Unit division 00    Status of Unit ISSUED,FINAL    Transfusion Status OK TO TRANSFUSE   Glucose, capillary     Status: Abnormal   Collection Time: 05/05/15  4:26 PM  Result Value Ref Range   Glucose-Capillary 138 (H) 65 - 99 mg/dL   Comment 1 Document in Chart   Glucose, capillary     Status: Abnormal   Collection Time: 05/05/15  9:14 PM  Result Value Ref Range   Glucose-Capillary 111 (H) 65 - 99 mg/dL   Comment 1  Notify RN   Blood gas, arterial     Status: Abnormal   Collection Time: 05/06/15  4:04 AM  Result Value Ref Range   FIO2 75.00    Delivery systems BILEVEL POSITIVE AIRWAY PRESSURE    Inspiratory PAP 18    Expiratory PAP 6    pH, Arterial 7.311 (L) 7.350 - 7.450   pCO2 arterial 50.3 (H) 35.0 - 45.0 mmHg   pO2, Arterial 81.3 80.0 - 100.0 mmHg   Bicarbonate 23.2 20.0 - 24.0 mEq/L   TCO2 14.9 0 - 100 mmol/L   Acid-Base Excess 0.7 0.0 - 2.0 mmol/L   O2 Saturation 94.9 %   Patient temperature 37.0    Collection site LEFT RADIAL    Drawn by [id number]    Sample type ARTERIAL    Allens test (pass/fail) PASS PASS  Basic metabolic panel  Status: Abnormal   Collection Time: 05/06/15  4:37 AM  Result Value Ref Range   Sodium 133 (L) 135 - 145 mmol/L   Potassium 4.8 3.5 - 5.1 mmol/L   Chloride 99 (L) 101 - 111 mmol/L   CO2 25 22 - 32 mmol/L   Glucose, Bld 86 65 - 99 mg/dL   BUN 30 (H) 6 - 20 mg/dL   Creatinine, Ser 1.07 0.61 - 1.24 mg/dL   Calcium 8.2 (L) 8.9 - 10.3 mg/dL   GFR calc non Af Amer >60 >60 mL/min   GFR calc Af Amer >60 >60 mL/min    Comment: (NOTE) The eGFR has been calculated using the CKD EPI equation. This calculation has not been validated in all clinical situations. eGFR's persistently <60 mL/min signify possible Chronic Kidney Disease.    Anion gap 9 5 - 15  CBC     Status: Abnormal   Collection Time: 05/06/15  4:37 AM  Result Value Ref Range   WBC 4.4 4.0 - 10.5 K/uL   RBC 1.77 (L) 4.22 - 5.81 MIL/uL   Hemoglobin 7.7 (L) 13.0 - 17.0 g/dL   HCT 21.8 (L) 39.0 - 52.0 %   MCV 123.2 (H) 78.0 - 100.0 fL   MCH 43.5 (H) 26.0 - 34.0 pg   MCHC 35.3 30.0 - 36.0 g/dL   RDW 19.6 (H) 11.5 - 15.5 %   Platelets 95 (L) 150 - 400 K/uL    Comment: SPECIMEN CHECKED FOR CLOTS PLATELET COUNT CONFIRMED BY SMEAR     ABGS  Recent Labs  05/06/15 0404  PHART 7.311*  PO2ART 81.3  TCO2 14.9  HCO3 23.2   CULTURES Recent Results (from the past 240 hour(s))  Urine culture      Status: None   Collection Time: 04/25/2015  4:37 AM  Result Value Ref Range Status   Specimen Description URINE, CLEAN CATCH  Final   Special Requests NONE  Final   Culture   Final    MULTIPLE SPECIES PRESENT, SUGGEST RECOLLECTION Performed at [facility]    Report Status 05/05/2015 FINAL  Final  MRSA PCR Screening     Status: None   Collection Time: 05/05/15  4:10 AM  Result Value Ref Range Status   MRSA by PCR NEGATIVE NEGATIVE Final    Comment:        The GeneXpert MRSA Assay (FDA approved for NASAL specimens only), is one component of a comprehensive MRSA colonization surveillance program. It is not intended to diagnose MRSA infection nor to guide or monitor treatment for MRSA infections.    Studies/Results: Mr 3d Recon At Scanner  05/04/2015   CLINICAL DATA:  Cirrhosis.  Elevated liver function studies.  EXAM: MRI ABDOMEN WITHOUT AND WITH CONTRAST (INCLUDING MRCP)  TECHNIQUE: Multiplanar multisequence MR imaging of the abdomen was performed both before and after the administration of intravenous contrast. Heavily T2-weighted images of the biliary and pancreatic ducts were obtained, and three-dimensional MRCP images were rendered by post processing.  CONTRAST:  10 cc MultiHance  COMPARISON:  CT scan 04/22/2015  FINDINGS: Examination is severely limited by breathing motion artifact.  There is a left pleural effusion and overlying atelectasis. No pericardial effusion.  There are cirrhotic changes involving the liver but no focal hepatic lesions are identified. No biliary dilatation. Mild splenomegaly is noted. There are extensive portal venous collaterals and esophageal varices. A splenorenal shunt is noted. Suspect cavernous transformation of the portal vein. The pancreas is grossly normal. No ductal dilatation, inflammation  or mass.  The stomach, duodenum, small bowel and colon are grossly normal. No mesenteric or retroperitoneal mass or adenopathy. The aorta and branch vessels  are patent.  Moderate ascites.  IMPRESSION: Very limited examination due to respiratory motion.  No obvious hepatic lesions or intrahepatic biliary dilatation. The pancreaticobiliary tree appears normal.  Cirrhosis with portal venous hypertension and large portal venous collaterals and esophageal varices. Moderate ascites.   Electronically Signed   By: Rudie Meyer M.D.   On: 05/04/2015 21:24   Dg Chest Port 1 View  05/05/2015   CLINICAL DATA:  Evaluate central line placement. History of CHF, diabetes, hypertension and coronary artery disease.  EXAM: PORTABLE CHEST - 1 VIEW  COMPARISON:  05/05/2015 at 8:16 a.m.  FINDINGS: Left PICC tip projects at the caval atrial junction.  Right lower lung zone consolidation is less apparent than on the earlier study, likely due to the erect position on the current exam the supine positioning previously, as well as different rotation. Left base is not well evaluated on the current exam. Stable cardiomegaly and changes from previous CABG surgery.  IMPRESSION: Left PICC is well positioned with its tip at the caval atrial junction.   Electronically Signed   By: Amie Portland M.D.   On: 05/05/2015 18:30   Dg Chest Port 1 View  05/05/2015   CLINICAL DATA:  Respiratory failure.  EXAM: PORTABLE CHEST - 1 VIEW  COMPARISON:  04/21/2015  FINDINGS: There is right lower lobe airspace disease. The lungs are otherwise clear. There is no pleural effusion or pneumothorax. There is stable cardiomegaly. There is evidence of prior median sternotomy. There is evidence of prior CABG.  The osseous structures are unremarkable.  IMPRESSION: Right lower lobe airspace disease most concerning for atelectasis versus pneumonia. Follow-up radiography is recommended.   Electronically Signed   By: Elige Ko   On: 05/05/2015 08:52   Dg Swallowing Func-speech Pathology  05/04/2015    Objective Swallowing Evaluation:    Patient Details  Name: Antonio Stevens MRN: [id number] Date of Birth: 1960-11-03   Today's Date: 05/04/2015 Time: SLP Start Time (ACUTE ONLY): 1640-SLP Stop Time (ACUTE ONLY): 1659 SLP Time Calculation (min) (ACUTE ONLY): 19 min  Past Medical History:  Past Medical History  Diagnosis Date  . CHF (congestive heart failure)   . CAD (coronary artery disease)   . Hypertension   . Anemia   . Hyperlipidemia   . Sleep apnea     does not use CPAP  . Obesity   . Diabetes mellitus without complication     patient denies since weight loss from throat cancer treatment  . Ulcerative colitis     Diagnosed in late 18s. Dr. Aleene Davidson before retired, last seen in 2013  . Dyspnea   . Cough   . Back pain   . COPD (chronic obstructive pulmonary disease)   . Erectile dysfunction   . Skin cancer   . Anemia of other chronic disease 07/28/2013  . Throat cancer     XRT, followed at [facility]  . Ascites   . Gastric varices   . Varices, esophageal   . Cirrhosis    Past Surgical History:  Past Surgical History  Procedure Laterality Date  . Cholecystectomy  2007  . Coronary artery bypass graft  1995  . Angioplasty    . Coronary stent placement  07/2014  . Rotator cuff repair    . Tonsillectomy    . Nasal septum surgery    . Colonoscopy  Dr. West Carbo, after age 59  . Esophagogastroduodenoscopy      Dr. West Carbo  . Knee arthroscopy     HPI:  Other Pertinent Information: CURTIS CAIN is a very pleasant 54 y.o.  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 Initial evaluation in the emergency department reveals jaundiced  patient with hypotension thrombocytopenia elevated INR. Patient states for  the last 2 weeks he's experienced progressive worsening generalized  weakness associated with poor appetite. Yesterday he developed epistaxis  nausea and vomiting with 2 episodes of emesis   No Data Recorded  Assessment / Plan / Recommendation CHL IP CLINICAL IMPRESSIONS 05/04/2015  Therapy Diagnosis Moderate oral phase dysphagia;Moderate pharyngeal phase  dysphagia  Clinical Impression PT presents with moderate oropharyngeal dysphagia  suspect secondary to previous radiation to larynx. Delay in swallow  evidenced with all consistencies. Thin liquid consistencies by teaspoon  yielded deep penetration to the vocal cords that cleared following the  swallow. Aspiration noted with thin liquids by cup which was not  consistently sensed by the pt. Chin tuck strategy also not effective in  preventing aspiration. Pt with better protection of airway with nectar  thick liquids when given small sips. Larger sips resulted in penetration  to the vocal cords, however no aspiration. Vallecular residuals present  with all PO secondary to poor base of tongue retraction. Penetration of  residuals noted after the swallow throughout MBS.  Suspect across a meal  residuals would become aspirated. Honey thick liquid trials resulted in  increased vallecular residuals. Pt with difficulty passing bairum tablet  throughout pharynx and esophagus. Pill remained in valleculae up to 2  minutes despite liquid wash down and was also static in esophagus 30  seconds before passing.  Pt reports difficulty with solid PO. Recommend  dysphagia 2 (chopped) and nectar thick liquids. Medicines crushed with  puree. Full supervision to reinforce safe swallow strategies. ST to follow  up.         CHL IP TREATMENT RECOMMENDATION 05/04/2015  Treatment Recommendations Therapy as outlined in treatment plan below     CHL IP DIET RECOMMENDATION 05/04/2015  SLP Diet Recommendations Dysphagia 2 (Fine chop);Nectar  Liquid Administration via (None)  Medication Administration Crushed with puree  Compensations Slow rate;Small sips/bites;Multiple dry swallows after each  bite/sip  Postural Changes and/or Swallow Maneuvers (None)     CHL IP OTHER RECOMMENDATIONS  05/04/2015  Recommended Consults (None)  Oral Care Recommendations Oral care BID  Other Recommendations Order thickener from pharmacy     No flowsheet data found.   CHL IP FREQUENCY AND DURATION 05/04/2015  Speech Therapy Frequency (ACUTE ONLY) min 2x/week  Treatment Duration 1 week     Pertinent Vitals/Pain     SLP Swallow Goals No flowsheet data found.  No flowsheet data found.    CHL IP REASON FOR REFERRAL 05/04/2015  Reason for Referral Objectively evaluate swallowing function            No flowsheet data found.  CHL IP GO 05/04/2015  Functional Assessment Tool Used skilled observation of PO  Functional Limitations Swallowing  Swallow Current Status (O1751) CM  Swallow Goal Status (W2585) CL  Swallow Discharge Status (I7782) (None)  Motor Speech Current Status (U2353) (None)  Motor Speech Goal Status (I1443) (None)  Motor Speech Goal Status (  G9158) (None)  Spoken Language Comprehension Current Status [phone]) (None)  Spoken Language Comprehension Goal Status [phone]) (None)  Spoken Language Comprehension Discharge Status [phone]) (None)  Spoken Language Expression Current Status [phone]) (None)  Spoken Language Expression Goal Status [phone]) (None)  Spoken Language Expression Discharge Status [phone]) (None)  Attention Current Status (W1093) (None)  Attention Goal Status (A3557) (None)  Attention Discharge Status [phone]) (None)  Memory Current Status (R4270) (None)  Memory Goal Status (W2376) (None)  Memory Discharge Status (E8315) (None)  Voice Current Status (V7616) (None)  Voice Goal Status (W7371) (None)  Voice Discharge Status (G6269) (None)  Other Speech-Language Pathology Functional Limitation [phone]) (None)  Other Speech-Language Pathology Functional Limitation Goal Status (E7035)  (None)  Other Speech-Language Pathology Functional Limitation Discharge Status  [phone]) (None)          Arvil Chaco MA, [place] Speech Language Pathologist    Levi Aland 05/04/2015, 5:20 PM    Mr Abd W/wo  Cm/mrcp  05/04/2015   CLINICAL DATA:  Cirrhosis.  Elevated liver function studies.  EXAM: MRI ABDOMEN WITHOUT AND WITH CONTRAST (INCLUDING MRCP)  TECHNIQUE: Multiplanar multisequence MR imaging of the abdomen was performed both before and after the administration of intravenous contrast. Heavily T2-weighted images of the biliary and pancreatic ducts were obtained, and three-dimensional MRCP images were rendered by post processing.  CONTRAST:  10 cc MultiHance  COMPARISON:  CT scan 04/30/2015  FINDINGS: Examination is severely limited by breathing motion artifact.  There is a left pleural effusion and overlying atelectasis. No pericardial effusion.  There are cirrhotic changes involving the liver but no focal hepatic lesions are identified. No biliary dilatation. Mild splenomegaly is noted. There are extensive portal venous collaterals and esophageal varices. A splenorenal shunt is noted. Suspect cavernous transformation of the portal vein. The pancreas is grossly normal. No ductal dilatation, inflammation or mass.  The stomach, duodenum, small bowel and colon are grossly normal. No mesenteric or retroperitoneal mass or adenopathy. The aorta and branch vessels are patent.  Moderate ascites.  IMPRESSION: Very limited examination due to respiratory motion.  No obvious hepatic lesions or intrahepatic biliary dilatation. The pancreaticobiliary tree appears normal.  Cirrhosis with portal venous hypertension and large portal venous collaterals and esophageal varices. Moderate ascites.   Electronically Signed   By: Marijo Sanes M.D.   On: 05/04/2015 21:24    Medications:  Prior to Admission:  Prescriptions prior to admission  Medication Sig Dispense Refill Last Dose  . albuterol (PROVENTIL HFA;VENTOLIN HFA) 108 (90 BASE) MCG/ACT inhaler Inhale 2 puffs into the lungs every 6 (six) hours as needed for wheezing.   Past Week at Unknown time  . aspirin EC 81 MG tablet Take 81 mg by mouth daily.   05/02/2015 at Unknown  time  . atorvastatin (LIPITOR) 40 MG tablet Take 40 mg by mouth daily.   05/02/2015 at Unknown time  . clopidogrel (PLAVIX) 75 MG tablet Take 75 mg by mouth daily.   05/02/2015 at Unknown time  . desvenlafaxine (PRISTIQ) 50 MG 24 hr tablet Take 50 mg by mouth daily.   05/02/2015 at Unknown time  . docusate sodium (COLACE) 100 MG capsule Take 100 mg by mouth daily as needed for mild constipation.    Past Week at Unknown time  . esomeprazole (NEXIUM) 40 MG capsule Take 40 mg by mouth daily before breakfast.   05/02/2015 at Unknown time  . fluticasone (FLONASE) 50 MCG/ACT nasal spray Place 2 sprays into the nose 2 (two) times  daily as needed for rhinitis.   05/02/2015 at Unknown time  . folic acid (FOLVITE) 1 MG tablet Take 1 mg by mouth daily.   05/02/2015 at Unknown time  . ipratropium-albuterol (DUONEB) 0.5-2.5 (3) MG/3ML SOLN Inhale 3 mLs into the lungs daily as needed.   unknown  . magnesium oxide (MAG-OX) 400 MG tablet Take 400 mg by mouth daily.   Past Week at Unknown time  . mercaptopurine (PURINETHOL) 50 MG tablet Take 100 mg by mouth daily. Caution: Chemotherapy.   05/02/2015 at Unknown time  . metoprolol tartrate (LOPRESSOR) 25 MG tablet Take 12.5 mg by mouth 2 (two) times daily.    05/02/2015 at 2000  . morphine (MS CONTIN) 15 MG 12 hr tablet Take 1 tablet by mouth daily as needed for pain.    unknown  . nitroGLYCERIN (NITROSTAT) 0.4 MG SL tablet Place 0.4 mg under the tongue every 5 (five) minutes as needed for chest pain.   unknown  . potassium chloride (K-DUR) 10 MEQ tablet Take 20 mEq by mouth daily as needed (weakness).    Past Month at Unknown time  . PRESCRIPTION MEDICATION Take 5 mLs by mouth every 6 (six) hours as needed (throat pain). [place] mouthwash suspension.   05/02/2015 at Unknown time  . tadalafil (CIALIS) 5 MG tablet Take 5 mg by mouth as needed for erectile dysfunction.   unknown  . tamsulosin (FLOMAX) 0.4 MG CAPS capsule Take 0.4 mg by mouth daily.    05/02/2015 at Unknown  time  . vardenafil (LEVITRA) 20 MG tablet Take 20 mg by mouth as needed for erectile dysfunction.   unknown  . vitamin B-12 (CYANOCOBALAMIN) 1000 MCG tablet Take 1,000 mcg by mouth daily.   Past Week at Unknown time   Scheduled: . sodium chloride   Intravenous Once  . albuterol  2.5 mg Nebulization Q4H  . antiseptic oral rinse  7 mL Mouth Rinse q12n4p  . antiseptic oral rinse  7 mL Mouth Rinse QID  . chlorhexidine  15 mL Mouth Rinse BID  . chlorhexidine gluconate  15 mL Mouth Rinse BID  . desvenlafaxine  50 mg Oral Daily  . fluticasone  2 spray Each Nare Daily  . folic acid  1 mg Intravenous Daily  . insulin aspart  0-9 Units Subcutaneous TID WC  . lactulose  300 mL Rectal Q6H  . magic mouthwash  15 mL Oral QID   And  . lidocaine  15 mL Mouth/Throat QID  . methylPREDNISolone (SOLU-MEDROL) injection  40 mg Intravenous Q6H  . pantoprazole (PROTONIX) IV  80 mg Intravenous Daily  . piperacillin-tazobactam (ZOSYN)  IV  3.375 g Intravenous Q8H  . sodium chloride  10-40 mL Intracatheter Q12H  . sodium chloride  3 mL Intravenous Q12H   Continuous: . sodium chloride    . dextrose 5 % and 0.9% NaCl     EKC:MKLKJZ chloride, fentaNYL (SUBLIMAZE) injection, fentaNYL (SUBLIMAZE) injection, ipratropium-albuterol, metoprolol, midazolam, midazolam, naLOXone (NARCAN)  injection, nitroGLYCERIN, ondansetron **OR** ondansetron (ZOFRAN) IV, sodium chloride, sodium chloride  Assesment: He has acute respiratory failure. He had been on BiPAP but has not tolerated that. Intubation was attempted yesterday but was unable to be successfully completed because of changes from his previous throat cancer and because he developed bleeding. It's not clear where the bleeding was from whether it's from his nasal cavity or from his known varices. After discussion with family by Dr. Caryn Section he was made no CODE BLUE which is absolutely appropriate  He has liver failure  which is acute on chronic. He has chronic alcoholic  cirrhosis  He has coagulopathy related to that  He has hepatic encephalopathy still a problem  I think he is aspirated and is being treated for that Principal Problem:   Liver failure Active Problems:   CAD (coronary artery disease)   Essential hypertension   Obesity   Diabetes mellitus without complication   Ulcerative colitis   COPD (chronic obstructive pulmonary disease)   Throat cancer   Thrombocytopenia   Varices, esophageal   Varices, gastric   Alcoholic cirrhosis of liver   Hyperbilirubinemia   Ascites   Splenomegaly   Liver failure, acute   Elevated LFTs   Odynophagia   Abdominal pain, generalized   Other pancytopenia   Diastolic dysfunction   Pulmonary hypertension   Coagulopathy   Acute respiratory failure with hypercapnia   Encephalopathy, hepatic   Aspiration pneumonia   Aortic stenosis, moderate    Plan: Continue current treatments continue antibiotics and sedation as needed lactulose. He will continue with all current medications but will not be resuscitated or intubated    LOS: 3 days   HAWKINS,EDWARD L 05/06/2015, 7:40 AM

## 2015-05-06 NOTE — Progress Notes (Signed)
Called to room post bathing for sp02 67%. Patient was pulled up in the bed and Bipap placed 16/6 with fio2 100%. Dr. Caryn Section notified and at the bedside, continue to monitor.

## 2015-05-06 NOTE — Progress Notes (Signed)
Gastroenterologist rounded on patient, stated to RN afterward that patient could have ice chips.  Gastroenterologist made aware that Hospitalist was concerned that patient may have aspirated ice chips earlier.  Gastroenterologist ordered for NPO except ice chips and sips with meds. Patient requesting ice chips, family given cup of ice chips by nursing staff.

## 2015-05-06 NOTE — Progress Notes (Signed)
Patient consistently asking for multiple cups of ice.  Patient given two cups of ice by family.

## 2015-05-06 NOTE — Progress Notes (Signed)
Multiple prolonged attempts to calm patient and to re-orient patient and included the use of mittens and various other less invasive techniques, Notified Dr Marin Comment as to occurences which included various successful attempts at breaking the BiPAP devices and pulling out various IV accesses. Order received for non-violent medical restraints.

## 2015-05-06 NOTE — Progress Notes (Signed)
Placed back on Bipap, with sp02 84-88%. Dr. Caryn Section at bedside with patient and family. Sp02 now 94%.

## 2015-05-06 NOTE — Progress Notes (Signed)
Patient's oxygen saturation dropped to low 80's.  Respiratory and MD notified.  MD came to bedside and spoke with family about treatment options.  Placed patient back on BiPAP and made patient strictly NPO.

## 2015-05-06 NOTE — Progress Notes (Signed)
[facility] PROGRESS NOTE  DURK CARMEN KAJ:[id number] DOB: August 29, 1960 DOA: 05/14/2015 PCP: Delphina Cahill, MD    Code Status: Full code Family Communication: Discussed with Sr. Glenda and. Disposition Plan: Discharge when clinically appropriate.  Consultants:  Pulmonary  gastroenterology  Procedures:  Intubation 9/17  2-D echocardiogram 05/04/15:- Left ventricle: The cavity size was mildly dilated. Wall thickness was increased in a pattern of mild LVH. Systolic function was normal. The estimated ejection fraction was in the range of 60% to 65%. Wall motion was normal; there were no regional wall motion abnormalities. Features are consistent with a pseudonormal left ventricular filling pattern, with concomitant abnormal relaxation and increased filling pressure (grade 2 diastolic dysfunction). - Aortic valve: Moderately calcified annulus. Trileaflet; moderately calcified leaflets. Cusp separation was reduced. There was moderate stenosis. Mean gradient (S): 28 mm Hg. Peak gradient (S): 50 mm Hg. VTI ratio of LVOT to aortic valve: 0.68. Valve area (VTI): 1.74 cm^2. Valve area (Vmax): 1.35 cm^2. - Mitral valve: Moderately calcified annulus. There was mild regurgitation. - Left atrium: The atrium was severely dilated. - Right ventricle: Systolic function was normal. - Right atrium: The atrium was mildly dilated. Central venous pressure (est): 8 mm Hg. - Tricuspid valve: There was moderate regurgitation. - Pulmonary arteries: Systolic pressure was moderately increased. PA peak pressure: 58 mm Hg (S). - Pericardium, extracardiac: There was no pericardial effusion. Impressions: - Mild LVH with mild LV chamber dilatation and LVEF 60-65%. Grade 2 diastolic dysfunction with increased filling pressures. Severe left atrial enlargement. Moderately calcified mitral annulus with mild mitral regurgitation. Moderate calcific aortic stenosis  as outlined above. Moderate tricuspid regurgitation with PASP 58 mmHg. Mild right atrial enlargement.  Antibiotics:  Zosyn 9/17>  HPI/Subjective: Overnight/this morning, the patient became very agitated, continuously trying to remove his BiPAP and other devices. He was calmed and redirected. The BiPAP was taken off. He eventually calmed down. Now he is sitting up in bed requesting ice chips of water because his mouth is dry.  Objective: Filed Vitals:   05/06/15 0805  BP:   Pulse:   Temp: 98.1 F (36.7 C)  Resp:   Temperature 98.1. Heart rate 110. Respiratory rate 21. Blood pressure 103/39. Oxygen saturation 94% on nasal cannula oxygen.  Intake/Output Summary (Last 24 hours) at 05/06/15 0842 Last data filed at 05/06/15 0700  Gross per 24 hour  Intake 2089.5 ml  Output   2150 ml  Net  -60.5 ml   Filed Weights   05/14/2015 0428 05/01/2015 0832 05/06/15 0500  Weight: 105.235 kg (232 lb) 105.235 kg (232 lb) 105.7 kg (233 lb 0.4 oz)    Exam:   General:  Obese, ill-appearing 54 year old who is more alert and less somnolent compared to yesterday.  Eyes: Scleral icterus bilaterally.  Cardiovascular: S1, S2, with soft systolic murmur and borderline tachycardia.  Respiratory: right greater than left crackles and occasional rhonchi. Breathing nonlabored.  Abdomen: obese, ?palpable ascites, nontender, positive bowel sounds.  Musculoskeletal/extremities: Trace-1+ of pedal edema bilaterally. No acute hot red joints.  Neurologic: He is less somnolent and more alert, but still mildly encephalopathic. He is oriented to himself, hospital, and sister. He follows small commands. His speech is clear.   Data Reviewed: Basic Metabolic Panel:  Recent Labs Lab 04/21/2015 0430 05/04/15 0618 05/05/15 0354 05/06/15 0437  NA 132* 132* 129* 133*  K 3.8 4.2 4.5 4.8  CL 97* 99* 96* 99*  CO2 $Re'30 29 28 25  'Bck$ GLUCOSE 166* 126* 151* 86  BUN 16 16 17  30*  CREATININE 0.56* 0.52* 0.64 1.07   CALCIUM 7.7* 7.4* 7.8* 8.2*   Liver Function Tests:  Recent Labs Lab 04/30/2015 0430 04/29/2015 0618 05/05/15 0354  AST 88* 75* 78*  ALT 77* 64* 69*  ALKPHOS 121 113 125  BILITOT 10.4* 9.4* 12.3*  PROT 5.5* 5.2* 6.2*  ALBUMIN 2.0* 1.9* 2.2*   No results for input(s): LIPASE, AMYLASE in the last 168 hours.  Recent Labs Lab 05/05/15 0354  AMMONIA 111*   CBC:  Recent Labs Lab 05/17/2015 0430 May 07, 2015 0618 05/05/15 0354 05/06/15 0437  WBC 3.3* 3.3* 5.8 4.4  NEUTROABS 1.8  --   --   --   HGB 9.1* 8.5* 9.6* 7.7*  HCT 25.0* 24.0* 26.8* 21.8*  MCV 121.4* 121.8* 121.3* 123.2*  PLT 58* 43* 55* 95*   Cardiac Enzymes:  Recent Labs Lab 05/17/2015 0430  TROPONINI <0.03   BNP (last 3 results)  Recent Labs  05/17/2015 0430  BNP 229.0*    ProBNP (last 3 results) No results for input(s): PROBNP in the last 8760 hours.  CBG:  Recent Labs Lab 05/05/15 0719 05/05/15 1128 05/05/15 1626 05/05/15 2114 05/06/15 0748  GLUCAP 148* 142* 138* 111* 81    Recent Results (from the past 240 hour(s))  Urine culture     Status: None   Collection Time: 04/25/2015  4:37 AM  Result Value Ref Range Status   Specimen Description URINE, CLEAN CATCH  Final   Special Requests NONE  Final   Culture   Final    MULTIPLE SPECIES PRESENT, SUGGEST RECOLLECTION Performed at [facility]    Report Status 05/05/2015 FINAL  Final  MRSA PCR Screening     Status: None   Collection Time: 05/05/15  4:10 AM  Result Value Ref Range Status   MRSA by PCR NEGATIVE NEGATIVE Final    Comment:        The GeneXpert MRSA Assay (FDA approved for NASAL specimens only), is one component of a comprehensive MRSA colonization surveillance program. It is not intended to diagnose MRSA infection nor to guide or monitor treatment for MRSA infections.      Studies: Mr 3d Recon At Scanner  05/06/2015   CLINICAL DATA:  Cirrhosis.  Elevated liver function studies.  EXAM: MRI ABDOMEN WITHOUT AND WITH  CONTRAST (INCLUDING MRCP)  TECHNIQUE: Multiplanar multisequence MR imaging of the abdomen was performed both before and after the administration of intravenous contrast. Heavily T2-weighted images of the biliary and pancreatic ducts were obtained, and three-dimensional MRCP images were rendered by post processing.  CONTRAST:  10 cc MultiHance  COMPARISON:  CT scan 05/13/2015  FINDINGS: Examination is severely limited by breathing motion artifact.  There is a left pleural effusion and overlying atelectasis. No pericardial effusion.  There are cirrhotic changes involving the liver but no focal hepatic lesions are identified. No biliary dilatation. Mild splenomegaly is noted. There are extensive portal venous collaterals and esophageal varices. A splenorenal shunt is noted. Suspect cavernous transformation of the portal vein. The pancreas is grossly normal. No ductal dilatation, inflammation or mass.  The stomach, duodenum, small bowel and colon are grossly normal. No mesenteric or retroperitoneal mass or adenopathy. The aorta and branch vessels are patent.  Moderate ascites.  IMPRESSION: Very limited examination due to respiratory motion.  No obvious hepatic lesions or intrahepatic biliary dilatation. The pancreaticobiliary tree appears normal.  Cirrhosis with portal venous hypertension and large portal venous collaterals and esophageal varices. Moderate ascites.   Electronically Signed  By: Marijo Sanes M.D.   On: 05/04/2015 21:24   Dg Chest Port 1 View  05/05/2015   CLINICAL DATA:  Evaluate central line placement. History of CHF, diabetes, hypertension and coronary artery disease.  EXAM: PORTABLE CHEST - 1 VIEW  COMPARISON:  05/05/2015 at 8:16 a.m.  FINDINGS: Left PICC tip projects at the caval atrial junction.  Right lower lung zone consolidation is less apparent than on the earlier study, likely due to the erect position on the current exam the supine positioning previously, as well as different rotation.  Left base is not well evaluated on the current exam. Stable cardiomegaly and changes from previous CABG surgery.  IMPRESSION: Left PICC is well positioned with its tip at the caval atrial junction.   Electronically Signed   By: Lajean Manes M.D.   On: 05/05/2015 18:30   Dg Chest Port 1 View  05/05/2015   CLINICAL DATA:  Respiratory failure.  EXAM: PORTABLE CHEST - 1 VIEW  COMPARISON:  04/29/2015  FINDINGS: There is right lower lobe airspace disease. The lungs are otherwise clear. There is no pleural effusion or pneumothorax. There is stable cardiomegaly. There is evidence of prior median sternotomy. There is evidence of prior CABG.  The osseous structures are unremarkable.  IMPRESSION: Right lower lobe airspace disease most concerning for atelectasis versus pneumonia. Follow-up radiography is recommended.   Electronically Signed   By: Kathreen Devoid   On: 05/05/2015 08:52   Dg Swallowing Func-speech Pathology  05/04/2015    Objective Swallowing Evaluation:    Patient Details  Name: ARION SHANKLES MRN: [id number] Date of Birth: [birth year]  Today's Date: 05/04/2015 Time: SLP Start Time (ACUTE ONLY): 1640-SLP Stop Time (ACUTE ONLY): 1659 SLP Time Calculation (min) (ACUTE ONLY): 19 min  Past Medical History:  Past Medical History  Diagnosis Date  . CHF (congestive heart failure)   . CAD (coronary artery disease)   . Hypertension   . Anemia   . Hyperlipidemia   . Sleep apnea     does not use CPAP  . Obesity   . Diabetes mellitus without complication     patient denies since weight loss from throat cancer treatment  . Ulcerative colitis     Diagnosed in late 65s. Dr. West Carbo before retired, last seen in 2013  . Dyspnea   . Cough   . Back pain   . COPD (chronic obstructive pulmonary disease)   . Erectile dysfunction   . Skin cancer   . Anemia of other chronic disease 07/28/2013  . Throat cancer     XRT, [facility] at [facility]  . Ascites   . Gastric varices   . Varices, esophageal   . Cirrhosis    Past Surgical History:   Past Surgical History  Procedure Laterality Date  . Cholecystectomy  2007  . Coronary artery bypass graft  1995  . Angioplasty    . Coronary stent placement  07/2014  . Rotator cuff repair    . Tonsillectomy    . Nasal septum surgery    . Colonoscopy      Dr. West Carbo, after age 77  . Esophagogastroduodenoscopy      Dr. West Carbo  . Knee arthroscopy     HPI:  Other Pertinent Information: DAMEIN GAUNCE is a very pleasant 54 y.o.  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 Initial evaluation in the emergency department reveals jaundiced  patient with hypotension thrombocytopenia elevated INR. Patient states for  the last 2 weeks he's experienced progressive worsening generalized  weakness associated with poor appetite. Yesterday he developed epistaxis  nausea and vomiting with 2 episodes of emesis  No Data Recorded  Assessment / Plan / Recommendation CHL IP CLINICAL IMPRESSIONS 05/04/2015  Therapy Diagnosis Moderate oral phase dysphagia;Moderate pharyngeal phase  dysphagia  Clinical Impression PT presents with moderate oropharyngeal dysphagia  suspect secondary to previous radiation to larynx. Delay in swallow  evidenced with all consistencies. Thin liquid consistencies by teaspoon  yielded deep penetration to the vocal cords that cleared following the  swallow. Aspiration noted with thin liquids by cup which was not  consistently sensed by the pt. Chin tuck strategy also not effective in  preventing aspiration. Pt with better protection of airway with nectar  thick liquids when given small sips. Larger sips resulted in penetration  to the vocal cords, however no aspiration. Vallecular residuals present  with all PO secondary to poor base of tongue retraction.  Penetration of  residuals noted after the swallow throughout MBS.  Suspect across a meal  residuals would become aspirated. Honey thick liquid trials resulted in  increased vallecular residuals. Pt with difficulty passing bairum tablet  throughout pharynx and esophagus. Pill remained in valleculae up to 2  minutes despite liquid wash down and was also static in esophagus 30  seconds before passing.  Pt reports difficulty with solid PO. Recommend  dysphagia 2 (chopped) and nectar thick liquids. Medicines crushed with  puree. Full supervision to reinforce safe swallow strategies. ST to follow  up.         CHL IP TREATMENT RECOMMENDATION 05/04/2015  Treatment Recommendations Therapy as outlined in treatment plan below     CHL IP DIET RECOMMENDATION 05/04/2015  SLP Diet Recommendations Dysphagia 2 (Fine chop);Nectar  Liquid Administration via (None)  Medication Administration Crushed with puree  Compensations Slow rate;Small sips/bites;Multiple dry swallows after each  bite/sip  Postural Changes and/or Swallow Maneuvers (None)     CHL IP OTHER RECOMMENDATIONS 05/04/2015  Recommended Consults (None)  Oral Care Recommendations Oral care BID  Other Recommendations Order thickener from pharmacy     No flowsheet data found.   CHL IP FREQUENCY AND DURATION 05/04/2015  Speech Therapy Frequency (ACUTE ONLY) min 2x/week  Treatment Duration 1 week     Pertinent Vitals/Pain     SLP Swallow Goals No flowsheet data found.  No flowsheet data found.    CHL IP REASON FOR REFERRAL 05/04/2015  Reason for Referral Objectively evaluate swallowing function            No flowsheet data found.  CHL IP GO 05/04/2015  Functional Assessment Tool Used skilled observation of PO  Functional Limitations Swallowing  Swallow Current Status (T3428) CM  Swallow Goal Status (J6811) CL  Swallow Discharge Status (X7262) (None)  Motor Speech Current Status (M3559) (None)  Motor Speech Goal Status (R4163) (None)  Motor Speech Goal Status (A4536) (None)  Spoken  Language Comprehension Current Status (I6803) (None)  Spoken Language Comprehension Goal Status (O1224) (None)  Spoken Language Comprehension Discharge Status [phone]) (None)  Spoken Language Expression Current Status [phone]) (None)  Spoken Language Expression Goal Status (G8916) (None)  Spoken Language Expression Discharge Status [phone]) (None)  Attention Current Status (U8828) (None)  Attention Goal Status (M0349) (None)  Attention Discharge Status (Z7915) (None)  Memory Current  Status [phone]) (None)  Memory Goal Status [phone]) (None)  Memory Discharge Status [phone]) (None)  Voice Current Status (A2022) (None)  Voice Goal Status (P2621) (None)  Voice Discharge Status [phone]) (None)  Other Speech-Language Pathology Functional Limitation [phone]) (None)  Other Speech-Language Pathology Functional Limitation Goal Status (K0177)  (None)  Other Speech-Language Pathology Functional Limitation Discharge Status  [phone]) (None)          Marcene Duos MA, CCC-SLP Acute Care Speech Language Pathologist    Kennieth Rad 05/04/2015, 5:20 PM    Mr Abd W/wo Cm/mrcp  05/04/2015   CLINICAL DATA:  Cirrhosis.  Elevated liver function studies.  EXAM: MRI ABDOMEN WITHOUT AND WITH CONTRAST (INCLUDING MRCP)  TECHNIQUE: Multiplanar multisequence MR imaging of the abdomen was performed both before and after the administration of intravenous contrast. Heavily T2-weighted images of the biliary and pancreatic ducts were obtained, and three-dimensional MRCP images were rendered by post processing.  CONTRAST:  10 cc MultiHance  COMPARISON:  CT scan 05/17/2015  FINDINGS: Examination is severely limited by breathing motion artifact.  There is a left pleural effusion and overlying atelectasis. No pericardial effusion.  There are cirrhotic changes involving the liver but no focal hepatic lesions are identified. No biliary dilatation. Mild splenomegaly is noted. There are extensive portal venous collaterals and esophageal varices. A  splenorenal shunt is noted. Suspect cavernous transformation of the portal vein. The pancreas is grossly normal. No ductal dilatation, inflammation or mass.  The stomach, duodenum, small bowel and colon are grossly normal. No mesenteric or retroperitoneal mass or adenopathy. The aorta and branch vessels are patent.  Moderate ascites.  IMPRESSION: Very limited examination due to respiratory motion.  No obvious hepatic lesions or intrahepatic biliary dilatation. The pancreaticobiliary tree appears normal.  Cirrhosis with portal venous hypertension and large portal venous collaterals and esophageal varices. Moderate ascites.   Electronically Signed   By: Rudie Meyer M.D.   On: 05/04/2015 21:24    Scheduled Meds: . sodium chloride   Intravenous Once  . albuterol  2.5 mg Nebulization Q4H  . antiseptic oral rinse  7 mL Mouth Rinse q12n4p  . antiseptic oral rinse  7 mL Mouth Rinse QID  . chlorhexidine  15 mL Mouth Rinse BID  . chlorhexidine gluconate  15 mL Mouth Rinse BID  . desvenlafaxine  50 mg Oral Daily  . fluticasone  2 spray Each Nare Daily  . folic acid  1 mg Intravenous Daily  . insulin aspart  0-9 Units Subcutaneous TID WC  . lactulose  300 mL Rectal Q6H  . magic mouthwash  15 mL Oral QID   And  . lidocaine  15 mL Mouth/Throat QID  . methylPREDNISolone (SOLU-MEDROL) injection  40 mg Intravenous Q6H  . pantoprazole (PROTONIX) IV  80 mg Intravenous Daily  . piperacillin-tazobactam (ZOSYN)  IV  3.375 g Intravenous Q8H  . sodium chloride  10-40 mL Intracatheter Q12H  . sodium chloride  3 mL Intravenous Q12H   Continuous Infusions: . sodium chloride    . dextrose 5 % and 0.9% NaCl 70 mL/hr at 05/06/15 0839    Assessment and plan:  Principal Problem:   Liver failure Active Problems:   Varices, esophageal   Varices, gastric   Alcoholic cirrhosis of liver   Hyperbilirubinemia   Elevated LFTs   Coagulopathy   Acute respiratory failure with hypercapnia   Aspiration pneumonia    Ulcerative colitis   Throat cancer   Ascites   Odynophagia   Abdominal pain, generalized  Encephalopathy, hepatic   CAD (coronary artery disease)   Essential hypertension   Obesity   Diabetes mellitus without complication   COPD (chronic obstructive pulmonary disease)   Thrombocytopenia   Splenomegaly   Liver failure, acute   Other pancytopenia   Diastolic dysfunction   Pulmonary hypertension   Aortic stenosis, moderate    1. Acute respiratory failure with hypoxia and hypercapnia secondary to aspiration pneumonia; complicated by encephalopathy. Patient became encephalopathic on 9/17. His ABG was consistent with respiratory acidosis/hypercapnia/and hypoxia. He was started on BiPAP, but with little improvement. Dr. Luan Pulling was consulted. Follow-up ABG was not improved. Patient's chest x-ray revealed a new right lower lobe pneumonia, likely from aspiration. -Dr. Luan Pulling recommended intubation for airway protection and treatment of respiratory failure. On 9/17, the nurse anesthetist  attempted to intubate the patient, but was met with some resistance, followed by evidence of upper airway and/or upper GI bleeding. So, with his history of throat cancer-status post radiation, esophageal varices, coagulopathy, thrombocytopenia, intubation was aborted. This was discussed with on-call intensivist Dr. Lamonte Sakai and pulmonologist, Dr. Luan Pulling. It was felt that the patient was too unstable to transfer to [facility]. Therefore, CODE STATUS was discussed with both of the patient's siblings. Following the discussion of a possibility of a catastrophic GI/upper airway hemorrhage with another attempted intubation, his siblings agreed to make him a DO NOT RESUSCITATE. -Patient was restarted on BiPAP. His follow-up ABG on 9/18 shows improvement. -Zosyn, albuterol nebulizer, IV Solu-Medrol were started and will be continued.   Acute encephalopathy, multifactorial including hypercapnea, hepatic encephalopathy, and  possibly opiate induced. The patient was apparently given a small dose of morphine superimposed on chronic MS Contin. Due to his decompensated  cirrhosis, he likely had decreased clearance. His ammonia level has also increased which contributed. His vitamin B12 level was not deficient. His TSH was within normal limits. -He was given Narcan 1 with minimal improvement. -With BiPAP, the patient is protecting his airway. -Lactulose was ordered and titrated up by GI, Dr. Oneida Alar. His ammonia level is still elevated, but has decreased to 97. -We'll try to transition lactulose to by mouth now that he is more alert. -MS Contin was discontinued. For chronic pain, will restart small doses of IV fentanyl when necessary.  Decompensated cirrhosis/liver failure-associated splenomegaly, perigastric and periesophageal varices, portal hypertension, pancytopenia, coagulopathy, elevated LFTs, and hyperbilirubinemia. -GI has been consulted and is following the patient. -Presumed etiology alcohol induced although the patient is been abstinent for 10 years per history. -Right heart failure was considered, but the patient's echo reveals normal right heart systolic function. -Hepatitis A total antibody was positive, but the remainder of the hepatitis serology panel was negative. -MRCP revealed cirrhosis with portal venous hypertension and large portal venous collaterals, esophageal varices, moderate ascites, but no obvious hepatic lesions or intrahepatic biliary dilatation. -Thiopurine metabolites pending. -Patient was given vitamin K 2 per GI. His INR did not improve much. Protonix drip started per GI. -Lactulose started and will be continued. His ammonia level has improved. -We'll monitor for need for paracentesis. Will ask GI to opine. -Because of some upper airway/upper GI bleeding which was short-lived following attempted intubation, he was given 1 unit of fresh frozen plasma and platelets on 9/17.  Macrocytic  anemia. The patient's hemoglobin was 9.1 with an MCV of greater than 120 on admission. His hemoglobin has drifted down, likely multifactorial from short-lived bleeding from attempted intubation, hemodilution from IV fluids, and decompensated cirrhosis. -We'll continue to monitor for the need for packed  red blood cell transfusions. -We'll order  total iron and folate levels.  History of throat cancer with resultant odynophagia and dysphagia. Likely the etiology of aspiration pneumonia. Patient was evaluated by the speech therapist and MBS. She recommended a dysphagia 2 diet with nectar thickened liquids, prior to attempted intubation. -Magic mouthwash with lidocaine was ordered for symptomatic treatment. -Patient had been nothing by mouth with respiratory failure. Now that he is more alert and requesting ice chips, this will be granted with caution. We'll try a few spoonfuls of nectar thickened liquids.  Grade 2 diastolic dysfunction, moderate aortic stenosis, pulmonary hypertension, per 2-D echocardiogram. Patient's EF was noted to be within normal limits at 60-65%. Right ventricular systolic function was apparently normal. -Metoprolol changed to IV when necessary for sustained heart rate greater than 120. -Will treat supportively. Gentle IV fluids with dextrose and potassium were started until the patient is reliable able to take solids by mouth.  Type 2 diabetes mellitus. Currently controlled on sliding scale NovoLog. We'll add Lantus in anticipation that his glucose will increase on steroids.  Time spent: 40 minutes critical care time.    [facility] Pager [phone]. If 7PM-7AM, please contact night-coverage at [email or web address], password [facility] 05/06/2015, 8:42 AM  LOS: 3 days

## 2015-05-06 NOTE — Progress Notes (Addendum)
Patient back on BiPAP and resting well at this time.

## 2015-05-06 NOTE — Progress Notes (Signed)
Paged MD and respiratory therapist to bedside r/t patient oxygen desaturation.  New orders received to make patient NPO at this time.

## 2015-05-06 NOTE — Progress Notes (Signed)
Hospitalist, herself, did a trial run of ice chips and stated patient swallowed without difficulty. MD ordered patient to have ice chips prn.  Patient given 1/2 cup of ice chips and tolerated well.

## 2015-05-07 ENCOUNTER — Inpatient Hospital Stay (HOSPITAL_COMMUNITY): Payer: Medicare PPO

## 2015-05-07 DIAGNOSIS — I350 Nonrheumatic aortic (valve) stenosis: Secondary | ICD-10-CM

## 2015-05-07 LAB — GLUCOSE, CAPILLARY
GLUCOSE-CAPILLARY: 77 mg/dL (ref 65–99)
Glucose-Capillary: 87 mg/dL (ref 65–99)

## 2015-05-07 LAB — CBC
HEMATOCRIT: 21.4 % — AB (ref 39.0–52.0)
HEMOGLOBIN: 7.5 g/dL — AB (ref 13.0–17.0)
MCH: 41.9 pg — ABNORMAL HIGH (ref 26.0–34.0)
MCHC: 35 g/dL (ref 30.0–36.0)
MCV: 119.6 fL — ABNORMAL HIGH (ref 78.0–100.0)
Platelets: 45 10*3/uL — ABNORMAL LOW (ref 150–400)
RBC: 1.79 MIL/uL — ABNORMAL LOW (ref 4.22–5.81)
RDW: 25.1 % — AB (ref 11.5–15.5)
WBC: 1.3 10*3/uL — AB (ref 4.0–10.5)

## 2015-05-07 LAB — HEPATIC FUNCTION PANEL
ALBUMIN: 1.8 g/dL — AB (ref 3.5–5.0)
ALK PHOS: 52 U/L (ref 38–126)
ALT: 49 U/L (ref 17–63)
AST: 94 U/L — ABNORMAL HIGH (ref 15–41)
Bilirubin, Direct: 6.8 mg/dL — ABNORMAL HIGH (ref 0.1–0.5)
Indirect Bilirubin: 6.6 mg/dL — ABNORMAL HIGH (ref 0.3–0.9)
TOTAL PROTEIN: 4.5 g/dL — AB (ref 6.5–8.1)
Total Bilirubin: 13.4 mg/dL — ABNORMAL HIGH (ref 0.3–1.2)

## 2015-05-07 LAB — TYPE AND SCREEN
ABO/RH(D): O POS
Antibody Screen: NEGATIVE
Unit division: 0

## 2015-05-07 LAB — BASIC METABOLIC PANEL
ANION GAP: 7 (ref 5–15)
BUN: 47 mg/dL — ABNORMAL HIGH (ref 6–20)
CO2: 24 mmol/L (ref 22–32)
Calcium: 7.3 mg/dL — ABNORMAL LOW (ref 8.9–10.3)
Chloride: 101 mmol/L (ref 101–111)
Creatinine, Ser: 1.45 mg/dL — ABNORMAL HIGH (ref 0.61–1.24)
GFR, EST NON AFRICAN AMERICAN: 53 mL/min — AB (ref >60)
Glucose, Bld: 104 mg/dL — ABNORMAL HIGH (ref 65–99)
POTASSIUM: 4.5 mmol/L (ref 3.5–5.1)
SODIUM: 132 mmol/L — AB (ref 135–145)

## 2015-05-07 LAB — PROTIME-INR
INR: 3.68 — AB (ref 0.00–1.49)
PROTHROMBIN TIME: 35.7 s — AB (ref 11.6–15.2)

## 2015-05-07 LAB — BLOOD GAS, ARTERIAL
Acid-Base Excess: 1.5 mmol/L (ref 0.0–2.0)
Bicarbonate: 23.1 mEq/L (ref 20.0–24.0)
DELIVERY SYSTEMS: POSITIVE
Drawn by: 22223
Expiratory PAP: 6
FIO2: 80
Inspiratory PAP: 16
O2 Saturation: 94.8 %
PCO2 ART: 39.5 mmHg (ref 35.0–45.0)
PH ART: 7.381 (ref 7.350–7.450)
TCO2: 11.4 mmol/L (ref 0–100)
pO2, Arterial: 76.8 mmHg — ABNORMAL LOW (ref 80.0–100.0)

## 2015-05-07 LAB — IRON AND TIBC
IRON: 53 ug/dL (ref 45–182)
Saturation Ratios: 45 % — ABNORMAL HIGH (ref 17.9–39.5)
TIBC: 119 ug/dL — ABNORMAL LOW (ref 250–450)
UIBC: 66 ug/dL

## 2015-05-07 LAB — AMMONIA: Ammonia: 94 umol/L — ABNORMAL HIGH (ref 9–35)

## 2015-05-07 MED ORDER — PANTOPRAZOLE SODIUM 40 MG IV SOLR
40.0000 mg | INTRAVENOUS | Status: DC
  Administered 2015-05-07: 40 mg via INTRAVENOUS
  Filled 2015-05-07: qty 40

## 2015-05-07 MED ORDER — DEXTROSE 5 % IV SOLN
0.0000 ug/min | INTRAVENOUS | Status: DC
  Administered 2015-05-07: 20 ug/min via INTRAVENOUS
  Filled 2015-05-07: qty 1

## 2015-05-07 MED ORDER — KETAMINE HCL 10 MG/ML IJ SOLN
INTRAMUSCULAR | Status: AC
  Filled 2015-05-07: qty 20

## 2015-05-07 MED ORDER — SODIUM CHLORIDE 0.9 % IV BOLUS (SEPSIS)
500.0000 mL | Freq: Once | INTRAVENOUS | Status: AC
  Administered 2015-05-07: 500 mL via INTRAVENOUS

## 2015-05-07 MED ORDER — MORPHINE SULFATE (PF) 2 MG/ML IV SOLN: 1.0000 mg | INTRAVENOUS | Status: DC | PRN

## 2015-05-07 MED ORDER — LORAZEPAM 2 MG/ML IJ SOLN: 0.5000 mg | INTRAMUSCULAR | Status: DC | PRN

## 2015-05-07 MED ORDER — FENTANYL CITRATE (PF) 100 MCG/2ML IJ SOLN: 12.5000 ug | INTRAMUSCULAR | Status: DC | PRN

## 2015-05-07 MED ORDER — FUROSEMIDE 10 MG/ML IJ SOLN
40.0000 mg | Freq: Once | INTRAMUSCULAR | Status: AC
  Administered 2015-05-07: 40 mg via INTRAVENOUS
  Filled 2015-05-07: qty 4

## 2015-05-08 LAB — FOLATE RBC
Folate, RBC: >2981 ng/mL (ref >498)
Hematocrit: 20.8 % — ABNORMAL LOW (ref 37.5–51.0)

## 2015-05-19 NOTE — Progress Notes (Addendum)
[facility] PROGRESS NOTE  Antonio Stevens HYW:[id number] DOB: Oct 26, 1960 DOA: 04/28/2015 PCP: Delphina Cahill, MD    Code Status: Full code Family Communication: Discussed with siblings JB and Glenda on 9/18. (The patient is not married and has no children). Disposition Plan: Discharge when clinically appropriate.  Consultants:  Pulmonary  gastroenterology  Procedures:  Left upper extremity PICC  Attempted Intubation 9/17-aborted  2-D echocardiogram 05/04/15:- Left ventricle: The cavity size was mildly dilated. Wall thickness was increased in a pattern of mild LVH. Systolic function was normal. The estimated ejection fraction was in the range of 60% to 65%. Wall motion was normal; there were no regional wall motion abnormalities. Features are consistent with a pseudonormal left ventricular filling pattern, with concomitant abnormal relaxation and increased filling pressure (grade 2 diastolic dysfunction). - Aortic valve: Moderately calcified annulus. Trileaflet; moderately calcified leaflets. Cusp separation was reduced. There was moderate stenosis. Mean gradient (S): 28 mm Hg. Peak gradient (S): 50 mm Hg. VTI ratio of LVOT to aortic valve: 0.68. Valve area (VTI): 1.74 cm^2. Valve area (Vmax): 1.35 cm^2. - Mitral valve: Moderately calcified annulus. There was mild regurgitation. - Left atrium: The atrium was severely dilated. - Right ventricle: Systolic function was normal. - Right atrium: The atrium was mildly dilated. Central venous pressure (est): 8 mm Hg. - Tricuspid valve: There was moderate regurgitation. - Pulmonary arteries: Systolic pressure was moderately increased. PA peak pressure: 58 mm Hg (S). - Pericardium, extracardiac: There was no pericardial effusion. Impressions: - Mild LVH with mild LV chamber dilatation and LVEF 60-65%. Grade 2 diastolic dysfunction with increased filling pressures. Severe left atrial  enlargement. Moderately calcified mitral annulus with mild mitral regurgitation. Moderate calcific aortic stenosis as outlined above. Moderate tricuspid regurgitation with PASP 58 mmHg. Mild right atrial enlargement.  Antibiotics:  Zosyn 9/17>  HPI/Subjective: Overnight/this morning, nursing reports decreasing blood pressure systolically in the 48N and 70s. Patient is semi-alert, but confused. He is able to request ice chips.  Objective: Filed Vitals:   05/27/2015 0807  BP:   Pulse:   Temp: 97.6 F (36.4 C)  Resp:   Temperature 97.6. Heart rate 117. Respiratory rate 36 Blood pressure 63/37 Oxygen saturation 95% on BiPAP and oxygen.  Intake/Output Summary (Last 24 hours) at May 27, 2015 0959 Last data filed at 05/27/15 0600  Gross per 24 hour  Intake   1539 ml  Output    200 ml  Net   1339 ml   Filed Weights   05/02/2015 0832 05/06/15 0500 May 27, 2015 0500  Weight: 105.235 kg (232 lb) 105.7 kg (233 lb 0.4 oz) 106.3 kg (234 lb 5.6 oz)    Exam:   General:  Obese, ill-appearing 54 year old who is confused, but is able to request ice chips.  Eyes: Scleral icterus bilaterally.  Cardiovascular: S1, S2, with soft systolic murmur and borderline tachycardia.  Respiratory: Bilateral rhonchi Breathing nonlabored.  Abdomen: obese, ?palpable ascites, nontender, positive bowel sounds.  Musculoskeletal/extremities: Trace-1+ of pedal edema bilaterally. No acute hot red joints.  Neurologic: He is less somnolent  but still encephalopathic. He is oriented to himself.  He follows small commands. His speech is garbled from the BiPAP apparatus.  Data Reviewed: Basic Metabolic Panel:  Recent Labs Lab 04/25/2015 0430 05/04/15 0618 05/05/15 0354 05/06/15 0437 May 27, 2015 0526  NA 132* 132* 129* 133* 132*  K 3.8 4.2 4.5 4.8 4.5  CL 97* 99* 96* 99* 101  CO2 _0 GLUCOSE 166* 126* 151* 86 104*  BUN 16 16  17 30* 47*  CREATININE 0.56* 0.52* 0.64 1.07 1.45*  CALCIUM 7.7* 7.4*  7.8* 8.2* 7.3*   Liver Function Tests:  Recent Labs Lab 05/15/2015 0430 05/04/15 0618 05/05/15 0354 05/06/15 0437 May 16, 2015 0526  AST 88* 75* 78* 67* 94*  ALT 77* 64* 69* 54 49  ALKPHOS 121 113 125 86 52  BILITOT 10.4* 9.4* 12.3* 14.4* 13.4*  PROT 5.5* 5.2* 6.2* 5.5* 4.5*  ALBUMIN 2.0* 1.9* 2.2* 2.2* 1.8*   No results for input(s): LIPASE, AMYLASE in the last 168 hours.  Recent Labs Lab 05/05/15 0354 05/06/15 0753 May 16, 2015 0526  AMMONIA 111* 97* 94*   CBC:  Recent Labs Lab 05/16/2015 0430 05/04/15 0618 05/05/15 0354 05/06/15 0437 2015/05/16 0526  WBC 3.3* 3.3* 5.8 4.4 1.3*  NEUTROABS 1.8  --   --   --   --   HGB 9.1* 8.5* 9.6* 7.7* 7.5*  HCT 25.0* 24.0* 26.8* 21.8* 21.4*  MCV 121.4* 121.8* 121.3* 123.2* 119.6*  PLT 58* 43* 55* 95* 45*   Cardiac Enzymes:  Recent Labs Lab 05/18/2015 0430  TROPONINI <0.03   BNP (last 3 results)  Recent Labs  04/24/2015 0430  BNP 229.0*    ProBNP (last 3 results) No results for input(s): PROBNP in the last 8760 hours.  CBG:  Recent Labs Lab 05/06/15 0748 05/06/15 1147 05/06/15 1542 05/06/15 2141 05-16-15 0745  GLUCAP 81 108* 101* 104* 87    Recent Results (from the past 240 hour(s))  Urine culture     Status: None   Collection Time: 04/29/2015  4:37 AM  Result Value Ref Range Status   Specimen Description URINE, CLEAN CATCH  Final   Special Requests NONE  Final   Culture   Final    MULTIPLE SPECIES PRESENT, SUGGEST RECOLLECTION Performed at [facility]    Report Status 05/05/2015 FINAL  Final  MRSA PCR Screening     Status: None   Collection Time: 05/05/15  4:10 AM  Result Value Ref Range Status   MRSA by PCR NEGATIVE NEGATIVE Final    Comment:        The GeneXpert MRSA Assay (FDA approved for NASAL specimens only), is one component of a comprehensive MRSA colonization surveillance program. It is not intended to diagnose MRSA infection nor to guide or monitor treatment for MRSA infections.       Studies: Dg Chest 1 View  05-16-15   CLINICAL DATA:  Respiratory distress.  EXAM: CHEST  1 VIEW  COMPARISON:  05/06/2015.  FINDINGS: Prior CABG. Cardiomegaly with progressive bilateral pulmonary alveolar infiltrates consistent with pulmonary edema. Small left pleural effusion. No pneumothorax.  IMPRESSION: Prior CABG. Cardiomegaly with progressive bilateral pulmonary alveolar infiltrates consistent with pulmonary edema. Small left pleural effusion.   Electronically Signed   By: Marcello Moores  Register   On: 05-16-15 07:13   Dg Chest Port 1 View  05/06/2015   CLINICAL DATA:  Follow-up infiltrates  EXAM: PORTABLE CHEST - 1 VIEW  COMPARISON:  9/17/ 16  FINDINGS: Cardiomediastinal silhouette is stable. Status post CABG. Persistent streaky right basilar atelectasis or infiltrate. Worsening left perihilar infiltrate. Small left pleural effusion with left basilar atelectasis or infiltrate. No convincing pulmonary edema.  IMPRESSION: Status post CABG. Persistent streaky right basilar atelectasis or infiltrate. Worsening left perihilar infiltrate. Small left pleural effusion with left basilar atelectasis or infiltrate. No convincing pulmonary edema.   Electronically Signed   By: Lahoma Crocker M.D.   On: 05/06/2015 09:31   Dg Chest [facility]  05/05/2015   CLINICAL DATA:  Evaluate central line placement. History of CHF, diabetes, hypertension and coronary artery disease.  EXAM: PORTABLE CHEST - 1 VIEW  COMPARISON:  05/05/2015 at 8:16 a.m.  FINDINGS: Left PICC tip projects at the caval atrial junction.  Right lower lung zone consolidation is less apparent than on the earlier study, likely due to the erect position on the current exam the supine positioning previously, as well as different rotation. Left base is not well evaluated on the current exam. Stable cardiomegaly and changes from previous CABG surgery.  IMPRESSION: Left PICC is well positioned with its tip at the caval atrial junction.   Electronically Signed    By: Lajean Manes M.D.   On: 05/05/2015 18:30    Scheduled Meds: . sodium chloride   Intravenous Once  . albuterol  2.5 mg Nebulization Q4H  . antiseptic oral rinse  7 mL Mouth Rinse q12n4p  . chlorhexidine  15 mL Mouth Rinse BID  . desvenlafaxine  50 mg Oral Daily  . fluticasone  2 spray Each Nare Daily  . folic acid  1 mg Intravenous Daily  . insulin aspart  0-9 Units Subcutaneous TID WC  . lactulose  300 mL Rectal TID  . magic mouthwash  15 mL Oral QID   And  . lidocaine  15 mL Mouth/Throat QID  . methylPREDNISolone (SOLU-MEDROL) injection  40 mg Intravenous Q6H  . piperacillin-tazobactam (ZOSYN)  IV  3.375 g Intravenous Q8H  . sodium chloride  10-40 mL Intracatheter Q12H  . sodium chloride  3 mL Intravenous Q12H  . thiamine  100 mg Intravenous Daily   Continuous Infusions: . dextrose 5 % and 0.9% NaCl 65 mL/hr at May 12, 2015 0000  . phenylephrine (NEO-SYNEPHRINE) Adult infusion 20 mcg/min (05-12-15 0905)    Assessment and plan:  Principal Problem:   Liver failure Active Problems:   Varices, esophageal   Varices, gastric   Alcoholic cirrhosis of liver   Hyperbilirubinemia   Elevated LFTs   Coagulopathy   Acute respiratory failure with hypercapnia   Aspiration pneumonia   Ulcerative colitis   Throat cancer   Ascites   Odynophagia   Abdominal pain, generalized   Encephalopathy, hepatic   CAD (coronary artery disease)   Essential hypertension   Obesity   Diabetes mellitus without complication   COPD (chronic obstructive pulmonary disease)   Thrombocytopenia   Splenomegaly   Liver failure, acute   Other pancytopenia   Diastolic dysfunction   Pulmonary hypertension   Aortic stenosis, moderate    1. Acute respiratory failure with hypoxia and hypercapnia secondary to aspiration pneumonia; complicated by encephalopathy. Patient became encephalopathic on 9/17. His ABG was consistent with respiratory acidosis/hypercapnia/and hypoxia. He was started on BiPAP, but  with little improvement. Dr. Luan Pulling was consulted. Follow-up ABG was not improved. Patient's chest x-ray revealed a new right lower lobe pneumonia, likely from aspiration. -Dr. Luan Pulling recommended intubation for airway protection and treatment of respiratory failure. On 9/17, the nurse anesthetist  attempted to intubate the patient, but was met with some resistance, followed by evidence of upper airway and/or upper GI bleeding. So, with his history of throat cancer-status post radiation, esophageal varices, coagulopathy, thrombocytopenia, intubation was aborted. This was discussed with on-call intensivist Dr. Lamonte Sakai and pulmonologist, Dr. Luan Pulling. It was felt that the patient was too unstable to transfer to [facility]. Therefore, CODE STATUS was discussed with both of the patient's siblings. Following the discussion of a possibility of a catastrophic GI/upper airway hemorrhage with another attempted  intubation, his siblings agreed to make him a DO NOT RESUSCITATE. -Patient was restarted on BiPAP. His follow-up ABG on 9/18 shows improvement. ABG on 9/19, this morning revealed improvement, but with high flow FiO2 and pressure support. -Patient was given thin liquids by family members on 9/18. Ice chips, thickened liquid and ice cream were tried on 9/18 due to patient request, but each time they were tried, the patient developed hypotension, hypoxia, and more tachycardia. Therefore, he was made to be completely nothing by mouth. This is discussed with his brother and sister who was in agreement. -Zosyn, albuterol nebulizer, IV Solu-Medrol were started and will be continued. -Overall prognosis is poor, again discussed with his siblings on 9/18. Would favor transitioning the patient to comfort care if he does not improve over the next 24 hours. This was also presented as an option during our conversation on 9/18.  Pulmonary edema. Follow-up chest x-ray on 9/19 revealed progressive bilateral pulmonary infiltrates  consistent with pulmonary edema. The edema may be secondary to decompensated diastolic heart failure superimposed on progressive cirrhosis and aspiration pneumonia. He was also given blood product transfusions with little Lasix secondary to his soft blood pressures. -Stated below, will start Neo-Synephrine. Will give him Lasix if his blood pressure tolerates it.  Hypotension. Likely from further deterioration of his cirrhotic liver disease and worsening renal function; impending multiorgan failure. Patient has been treated with IV fluids, platelet transfusion, fresh frozen plasma. He was transfused 1 unit of packed red blood cells overnight for hypotension. Although the patient's prognosis is poor. Will treat give a trial of Neo-Synephrine drip. This was discussed with Dr. Luan Pulling. If the patient's blood pressure does not improve, will discuss transitioning the patient to comfort care with his siblings.  Acute encephalopathy, multifactorial including hypercapnea, hepatic encephalopathy, and possibly opiate induced. The patient was apparently given a small dose of morphine superimposed on chronic MS Contin. Due to his decompensated  cirrhosis, he likely had decreased clearance. His ammonia level has also increased which contributed. His vitamin B12 level was not deficient. His TSH was within normal limits. -He was given Narcan 1 with minimal improvement. -With BiPAP, the patient is protecting his airway. -Lactulose was ordered and titrated up by GI, Dr. Oneida Alar. His ammonia level is still elevated, but has decreased to 94. -MS Contin was discontinued. For chronic pain,  restarted small doses of IV fentanyl when necessary.  Decompensated cirrhosis/liver failure-associated splenomegaly, perigastric and periesophageal varices, portal hypertension, pancytopenia, coagulopathy, elevated LFTs, and hyperbilirubinemia. -GI has been consulted and is following the patient. -Presumed etiology alcohol induced  although the patient is been abstinent for 10 years per history. -Right heart failure was considered, but the patient's echo reveals normal right heart systolic function. -Hepatitis A total antibody was positive, but the remainder of the hepatitis serology panel was negative. -MRCP revealed cirrhosis with portal venous hypertension and large portal venous collaterals, esophageal varices, moderate ascites, but no obvious hepatic lesions or intrahepatic biliary dilatation. -Thiopurine metabolites pending. -Patient was given vitamin K 2 per GI. His INR did not improve much. Protonix drip started per GI. -Lactulose started and will be continued. His ammonia level has improved. -We'll monitor for need for paracentesis. Will ask GI to opine. -Because of some upper airway/upper GI bleeding which was short-lived following attempted intubation, he was given 1 unit of fresh frozen plasma and platelets on 9/17. -His LFTs are modestly elevated; his total bilirubin was 10.4 and admission and is 13.4 today.  Macrocytic anemia. The patient's  hemoglobin was 9.1 with an MCV of greater than 120 on admission. His hemoglobin has drifted down, likely multifactorial from short-lived bleeding from attempted intubation, hemodilution from IV fluids, and decompensated cirrhosis. -His vitamin B12 was elevated at 3716. Total iron and RBC folate are pending. -He was transfused 1 unit of packed blood cells on 9/18 with no significant prove what in his H&H. There is no obvious source of gross bleeding. We'll continue to monitor for further need of packed red blood cell transfusion.  History of throat cancer with resultant odynophagia and dysphagia. Likely the etiology of aspiration pneumonia. Patient was evaluated by the speech therapist and MBS. She recommended a dysphagia 2 diet with nectar thickened liquids, prior to attempted intubation. -Magic mouthwash with lidocaine was ordered for symptomatic treatment. -Patient had  been nothing by mouth with respiratory failure. On 9/18 when he became more alert, he began requesting ice chips. They were tried along with thickened liquids and ice cream. However, the patient decompensated each time oral intake was tried. Therefore, he was made to be completely nothing by mouth.  Grade 2 diastolic dysfunction, moderate aortic stenosis, pulmonary hypertension, per 2-D echocardiogram. Patient's EF was noted to be within normal limits at 60-65%. Right ventricular systolic function was apparently normal. -Metoprolol changed to IV when necessary for sustained heart rate greater than 120. -Chest x-ray on 9/19 shows progressive pulmonary edema superimposed on pneumonia/aspiration. -The patient's blood pressure is too low to initiate Lasix at this time. As stated above, Neo-Synephrine has been started. If his blood pressure improves, we will give IV Lasix.  Type 2 diabetes mellitus. Currently controlled on sliding scale NovoLog.  Time spent: 40 minutes critical care time.    [facility] Pager [phone]. If 7PM-7AM, please contact night-coverage at [email or web address], password [facility] 2015/05/26, 9:59 AM  LOS: 4 days

## 2015-05-19 NOTE — Progress Notes (Signed)
Pt blood pressure has been very labile this morning.  Attempted multiple times to obtain a manual blood pressure.  Monitor-based BP cuff has been changed from medium to large and moved from arm to leg to get a more reliable reading to no effect.  In light of difficulty obtaining accurate BP reading and the pt's ability to converse (albeit with marked confusion) with his family and nursing staff, phenylephrine titration will be done slowly.  Pt also had bloody secretions in his BiPAP mask.  Oral care was performed and mask cleaned.  Will notify MD if bleeding from the oral cavity continues.

## 2015-05-19 NOTE — Progress Notes (Signed)
Pt requested to be taken off BiPAP.  BiPAP removed around 1345.  Pt had agonal respirations at 1358 and was in asystole at 1401.  Family at the bedside.  Death confirmed by myself and Sharene Skeans, RN.  Dr. Caryn Section notified.  [place] [facility] reference # [id number].

## 2015-05-19 NOTE — Discharge Summary (Signed)
Death Summary  Antonio Stevens IRJ:[id number] DOB: [birth year] DOA: May 14, 2015  PCP: Delphina Cahill, MD PCP/Office notified: Not yet.  Admit date: May 14, 2015 Date of Death: 2015/05/19  Final Diagnoses:  1. Decompensated cirrhosis/liver failure, which ultimately led to his death. 2. End-stage cirrhosis with associated splenomegaly, ascites. perigastric and periesophageal varices, portal hypertension. 3. Pancytopenia and coagulopathy secondary to severe liver disease/cirrhosis. 4. Elevated LFTs and hyperbilirubinemia secondary to alcoholic liver disease. 5. Acute respiratory failure with hypoxia and hypercapnia, secondary to aspiration pneumonia; complicated by encephalopathy and liver disease. 6. Grade 2 diastolic dysfunction, moderate aortic stenosis, and pulmonary hypertension per 2-D echocardiogram. 7. History of throat cancer, status post radiation. 8. Dysphagia and odontophagia secondary to history of throat cancer. 9. COPD. 10. Type 2 diabetes mellitus. 11. History of ulcerative colitis.      History of present illness:  The patient was a 54 year old man with a history of CAD, hypertension, ulcerative colitis, diabetes mellitus, throat cancer, and remote alcohol abuse, who presented to the emergency department on 14-May-2015 with a chief complaint of generalized weakness. Evaluation in the emergency department revealed a hemoglobin of 9.1, platelet count of 58, MCV of 121, INR of 2.58, glucose 166, AST of 88, total bilirubin of 10.4, and a negative troponin I. CT of his abdomen revealed progressive cirrhosis with associated ascites, perigastric and periesophageal varices. His chest x-ray revealed cardiomegaly with vascular congestion. He was admitted for further evaluation and management.   Hospital Course:   Decompensated cirrhosis/liver failure-associated splenomegaly, perigastric and periesophageal varices, portal hypertension, pancytopenia, coagulopathy, elevated LFTs, and  hyperbilirubinemia. -GI was consulted and  followed the patient. -Presumed etiology alcohol induced although the patient had been abstinent for 10 years per history. -Right heart failure was considered, but the patient's echo reveals normal right heart systolic function. -Hepatitis A total antibody was positive, but the remainder of the hepatitis serology panel was negative. -MRCP revealed cirrhosis with portal venous hypertension and large portal venous collaterals, esophageal varices, moderate ascites, but no obvious hepatic lesions or intrahepatic biliary dilatation. -Thiopurine metabolites were pending. -Patient was given vitamin K 2 per GI. His INR did not improve much. Protonix drip started per GI. -Lactulose started. His ammonia level has improved. -Patient became more encephalopathic and decompensated leading to acute respiratory failure. Respiratory failure was both hypoxic and hypercapnic. This was thought to be secondary to aspiration pneumonia. See below. -Because of some upper airway/upper GI bleeding which was short-lived following attempted intubation, he was given 1 unit of fresh frozen plasma and platelets and subsequently one unit of packed blood cells.  Acute respiratory failure with hypoxia and hypercapnia secondary to aspiration pneumonia; complicated by encephalopathy and end-stage cirrhosis. Patient became encephalopathic on 9/17. His ABG was consistent with respiratory acidosis/hypercapnia/and hypoxia. He was started on BiPAP, but with little improvement. Dr. Luan Pulling was consulted. Follow-up ABG was not improved. Patient's chest x-ray revealed a new right lower lobe pneumonia, likely from aspiration. -Dr. Luan Pulling recommended intubation for airway protection and treatment of respiratory failure. On 9/17, the nurse anesthetist attempted to intubate the patient, but was met with some resistance, followed by evidence of upper airway and/or upper GI bleeding. So, with his history  of throat cancer-status post radiation, esophageal varices, coagulopathy, thrombocytopenia, intubation was aborted. This was discussed with on-call intensivist Dr. Lamonte Sakai and pulmonologist, Dr. Luan Pulling. It was felt that the patient was too unstable to transfer to [facility]. Therefore, CODE STATUS was discussed with both of the patient's siblings. Following the discussion of a possibility of  a catastrophic GI/upper airway hemorrhage with another attempted intubation, his siblings agreed to make him a DO NOT RESUSCITATE. -Patient was restarted on BiPAP. His follow-up ABG on 9/18 shows improvement. ABG on 9/19, this morning revealed improvement, but with high flow FiO2 and pressure support. -Patient was given thin liquids by family members on 9/18. Ice chips, thickened liquid and ice cream were tried on 9/18 due to patient request, but each time they were tried, the patient developed hypotension, hypoxia, and more tachycardia. Therefore, he was made to be completely nothing by mouth. This is discussed with his brother and sister who was in agreement. -Zosyn, albuterol nebulizer, IV Solu-Medrol were started. Patient began to become hypotensive requiring pressors. He was transfused a unit of packed red blood cells. -When it was obvious that the patient was declining rapidly on pressors, IV fluids, and aggressive support, both his sister and brother Holley Raring and [place]) decided to transition him over to comfort care. Therefore, medications were de-escalated. He was taken off of BiPAP and placed on supplemental oxygen. A few hours later. The patient became asystolic and expired.     Time: 20 minutes.  Signed:  FISHER,DENISE  [facility] 05/08/2015, 7:02 PM

## 2015-05-19 NOTE — Progress Notes (Signed)
ADDENDUM:  I met again with the patient's sister Holley Raring and brother BJ. They have decided that they want the patient transition to comfort care. Will therefore de-escalate medications. Will continue medications that are conducive to comfort. Will allow full liquids as tolerated. We'll discontinue BiPAP as tolerated. We'll place nasal cannula oxygen or entering mask for comfort. We'll keep the patient in the ICU setting on the comfort care protocol. If he survives over the next 18 hours, he will be transferred to a MedSurg bed. The ultimate goal is to discharge him to hospice home as desired by family.

## 2015-05-19 NOTE — Care Management Note (Signed)
Case Management Note  Patient Details  Name: Antonio Stevens MRN: [id number] Date of Birth: 1961-02-20  Expected Discharge Date:  05/06/15               Expected Discharge Plan:  [place]  In-House Referral:  NA  Discharge planning Services  CM Consult  Post Acute Care Choice:  Home Health Choice offered to:  Patient  DME Arranged:    DME Agency:     HH Arranged:  PT, Nurse's Aide Rodriguez Hevia Agency:  [facility]  Status of Service:  Completed, signed off  Medicare Important Message Given:    Date Medicare IM Given:    Medicare IM give by:    Date Additional Medicare IM Given:    Additional Medicare Important Message give by:     If discussed at [place] of Stay Meetings, dates discussed:    Additional Comments: Pt's continues to be medically unstable. Will cont to follow for DC planning.  Sherald Barge, RN 05/12/2015, 12:48 PM

## 2015-05-19 NOTE — Progress Notes (Signed)
SLP Cancellation Note  Patient Details Name: JOVANIE VERGE MRN: [id number] DOB: 06-24-1961   Cancelled treatment:       Reason Eval/Treat Not Completed: Medical issues which prohibited therapy; Acknowledge decline in medical status over the weekend. MD note: <<Patient was restarted on BiPAP. His follow-up ABG on 9/18 shows improvement. ABG on 9/19, this morning revealed improvement, but with high flow FiO2 and pressure support. -Patient was given thin liquids by family members on 9/18. Ice chips, thickened liquid and ice cream were tried on 9/18 due to patient request, but each time they were tried, the patient developed hypotension, hypoxia, and more tachycardia. Therefore, he was made to be completely nothing by mouth. This is discussed with his brother and sister who was in agreement. -Zosyn, albuterol nebulizer, IV Solu-Medrol were started and will be continued. -Overall prognosis is poor, again discussed with his siblings on 9/18. Would favor transitioning the patient to comfort care if he does not improve over the next 24 hours. >>  Please re-consult as needed.  Thank you,  Genene Churn, [place]    [place] 2015-05-14, 10:47 AM

## 2015-05-19 NOTE — Progress Notes (Addendum)
Ketamine 200 mg IV drawn up on patient 05-11-15 at [facility].  Not needed.  Ketamine 200 mg wasted in sink. Witnessed by Drucie Opitz, CRNA. I agree. I witnessed 200 mg Ketamine wasted in sink. Sherrill Raring CRNA

## 2015-05-19 NOTE — Progress Notes (Signed)
Subjective: Agitated, asking for mitts to be removed. Hypotensive. Confused. No overt GI bleeding.   Objective: Vital signs in last 24 hours: Temp:  [97.3 F (36.3 C)-98.3 F (36.8 C)] 97.6 F (36.4 C) (09/19 0807) Pulse Rate:  [100-168] 117 (09/19 0758) Resp:  [15-36] 36 (09/19 0758) BP: (59-125)/(24-92) 63/37 mmHg (09/19 0758) SpO2:  [79 %-100 %] 95 % (09/19 0600) FiO2 (%):  [70 %-100 %] 70 % (09/19 0756) Weight:  [234 lb 5.6 oz (106.3 kg)] 234 lb 5.6 oz (106.3 kg) (09/19 0500) Last BM Date: 05/06/15 General:   Confused, agitated Head:  Normocephalic and atraumatic. Abdomen:  Bowel sounds present, obese, distended but soft, no TTP Neurologic:  Unable to assess, confused, asking for mitts to be removed Skin:  Warm and dry, intact without significant lesions.  Psych:  Agitated  Intake/Output from previous day: 09/18 0701 - 09/19 0700 In: 1944 [I.V.:1460; Blood:284; IV Piggyback:200] Out: 200 [Urine:200] Intake/Output this shift:    Lab Results:  Recent Labs  05/05/15 0354 05/06/15 0437 27-May-2015 0526  WBC 5.8 4.4 1.3*  HGB 9.6* 7.7* 7.5*  HCT 26.8* 21.8* 21.4*  PLT 55* 95* 45*   BMET  Recent Labs  05/05/15 0354 05/06/15 0437 2015-05-27 0526  NA 129* 133* 132*  K 4.5 4.8 4.5  CL 96* 99* 101  CO2 28 25 24   GLUCOSE 151* 86 104*  BUN 17 30* 47*  CREATININE 0.64 1.07 1.45*  CALCIUM 7.8* 8.2* 7.3*   LFT  Recent Labs  05/05/15 0354 05/06/15 0437 05/27/15 0526  PROT 6.2* 5.5* 4.5*  ALBUMIN 2.2* 2.2* 1.8*  AST 78* 67* 94*  ALT 69* 54 49  ALKPHOS 125 86 52  BILITOT 12.3* 14.4* 13.4*  BILIDIR  --  6.4* 6.8*  IBILI  --  8.0* 6.6*   PT/INR  Recent Labs  05/05/15 0354  LABPROT 23.8*  INR 2.15*    Studies/Results: Dg Chest 1 View  2015-05-27   CLINICAL DATA:  Respiratory distress.  EXAM: CHEST  1 VIEW  COMPARISON:  05/06/2015.  FINDINGS: Prior CABG. Cardiomegaly with progressive bilateral pulmonary alveolar infiltrates consistent with pulmonary  edema. Small left pleural effusion. No pneumothorax.  IMPRESSION: Prior CABG. Cardiomegaly with progressive bilateral pulmonary alveolar infiltrates consistent with pulmonary edema. Small left pleural effusion.   Electronically Signed   By: Marcello Moores  Register   On: May 27, 2015 07:13   Dg Chest Port 1 View  05/06/2015   CLINICAL DATA:  Follow-up infiltrates  EXAM: PORTABLE CHEST - 1 VIEW  COMPARISON:  9/17/ 16  FINDINGS: Cardiomediastinal silhouette is stable. Status post CABG. Persistent streaky right basilar atelectasis or infiltrate. Worsening left perihilar infiltrate. Small left pleural effusion with left basilar atelectasis or infiltrate. No convincing pulmonary edema.  IMPRESSION: Status post CABG. Persistent streaky right basilar atelectasis or infiltrate. Worsening left perihilar infiltrate. Small left pleural effusion with left basilar atelectasis or infiltrate. No convincing pulmonary edema.   Electronically Signed   By: Lahoma Crocker M.D.   On: 05/06/2015 09:31   Dg Chest Port 1 View  05/05/2015   CLINICAL DATA:  Evaluate central line placement. History of CHF, diabetes, hypertension and coronary artery disease.  EXAM: PORTABLE CHEST - 1 VIEW  COMPARISON:  05/05/2015 at 8:16 a.m.  FINDINGS: Left PICC tip projects at the caval atrial junction.  Right lower lung zone consolidation is less apparent than on the earlier study, likely due to the erect position on the current exam the supine positioning previously, as well as  different rotation. Left base is not well evaluated on the current exam. Stable cardiomegaly and changes from previous CABG surgery.  IMPRESSION: Left PICC is well positioned with its tip at the caval atrial junction.   Electronically Signed   By: Lajean Manes M.D.   On: 05/05/2015 18:30    Assessment: 54 year old male with multiple comorbidities, admitted with decompensated cirrhosis now with worsening clinical status, recent traumatic attempt at intubation, on Bipap but continuing to  decline.  DNR. Overall prognosis is guarded, poor. Would recommend considering comfort care in this situation due to multiple comorbidities and declining status. Neo-synephrine has been ordered.   As of note, thiopurine metabolites ordered but not resulted. Will request results. Likely had to be sent off.   Plan: INR Supportive care, continuing lactulose enemas TID Protonix IV 40 mg once daily Consider comfort care    Orvil Feil, ANP-BC [facility]    LOS: 4 days    May 22, 2015, 9:55 AM

## 2015-05-19 NOTE — Progress Notes (Signed)
Subjective: He does respond. He is on BiPAP. He seems to be generally deteriorating and now has hypotension as well as increased respiratory effort.  Objective: Vital signs in last 24 hours: Temp:  [97.3 F (36.3 C)-98.3 F (36.8 C)] 98.3 F (36.8 C) (09/19 0400) Pulse Rate:  [100-168] 168 (09/19 0600) Resp:  [15-29] 25 (09/19 0600) BP: (59-125)/(24-92) 70/27 mmHg (09/19 0600) SpO2:  [79 %-100 %] 95 % (09/19 0600) FiO2 (%):  [80 %-100 %] 80 % (09/19 0344) Weight:  [106.3 kg (234 lb 5.6 oz)] 106.3 kg (234 lb 5.6 oz) (09/19 0500) Weight change: 0.6 kg (1 lb 5.2 oz) Last BM Date: 05/06/15  Intake/Output from previous day: 09/18 0701 - 09/19 0700 In: 1944 [I.V.:1460; Blood:284; IV Piggyback:200] Out: 200 [Urine:200]  PHYSICAL EXAM General appearance: alert and moderate distress Resp: rhonchi bilaterally Cardio: regular rate and rhythm, S1, S2 normal, no murmur, click, rub or gallop GI: soft, non-tender; bowel sounds normal; no masses,  no organomegaly Extremities: extremities normal, atraumatic, no cyanosis or edema  Lab Results:  Results for orders placed or performed during the hospital encounter of 04/25/2015 (from the past 48 hour(s))  Blood gas, arterial     Status: Abnormal   Collection Time: 05/05/15 10:52 AM  Result Value Ref Range   FIO2 0.75    O2 Content 75.0 L/min   Delivery systems BILEVEL POSITIVE AIRWAY PRESSURE    Mode BILEVEL POSITIVE AIRWAY PRESSURE    Inspiratory PAP 18    Expiratory PAP 6    pH, Arterial 7.155 (LL) 7.350 - 7.450    Comment: CRITICAL RESULT CALLED TO, READ BACK BY AND VERIFIED WITH: MICHEAL GRAY,RN ON 05/04/2016 BY PEVIANY LAWSON,RRT AT 1058.    pCO2 arterial 67.3 (HH) 35.0 - 45.0 mmHg    Comment: CRITICAL RESULT CALLED TO, READ BACK BY AND VERIFIED WITH: MICHEAL GRAY,RN ON 05/05/2015 BY PEVIANY LAWSON,RRT AT 0347.    pO2, Arterial 111 (H) 80.0 - 100.0 mmHg   Bicarbonate 19.4 (L) 20.0 - 24.0 mEq/L   Acid-base deficit 4.8 (H) 0.0 - 2.0  mmol/L   O2 Saturation 96.5 %   Collection site RIGHT RADIAL    Drawn by [phone]    Sample type ARTERIAL    Allens test (pass/fail) PASS PASS  Glucose, capillary     Status: Abnormal   Collection Time: 05/05/15 11:28 AM  Result Value Ref Range   Glucose-Capillary 142 (H) 65 - 99 mg/dL   Comment 1 Document in Chart   Type and screen     Status: None   Collection Time: 05/05/15 11:40 AM  Result Value Ref Range   ABO/RH(D) O POS    Antibody Screen NEG    Sample Expiration 05/08/2015    Unit Number [id number]    Blood Component Type RBC LR PHER1    Unit division 00    Status of Unit ISSUED,FINAL    Transfusion Status OK TO TRANSFUSE    Crossmatch Result Compatible   Prepare fresh frozen plasma     Status: None   Collection Time: 05/05/15 11:40 AM  Result Value Ref Range   Unit Number [id number]    Blood Component Type THAWED PLASMA    Unit division 00    Status of Unit ISSUED,FINAL    Transfusion Status OK TO TRANSFUSE   Prepare Pheresed Platelets     Status: None   Collection Time: 05/05/15 11:40 AM  Result Value Ref Range   Unit Number [id number]    Blood Component Type  PLTPHER LR1    Unit division 00    Status of Unit ISSUED,FINAL    Transfusion Status OK TO TRANSFUSE   Glucose, capillary     Status: Abnormal   Collection Time: 05/05/15  4:26 PM  Result Value Ref Range   Glucose-Capillary 138 (H) 65 - 99 mg/dL   Comment 1 Document in Chart   Glucose, capillary     Status: Abnormal   Collection Time: 05/05/15  9:14 PM  Result Value Ref Range   Glucose-Capillary 111 (H) 65 - 99 mg/dL   Comment 1 Notify RN   Blood gas, arterial     Status: Abnormal   Collection Time: 05/06/15  4:04 AM  Result Value Ref Range   FIO2 75.00    Delivery systems BILEVEL POSITIVE AIRWAY PRESSURE    Inspiratory PAP 18    Expiratory PAP 6    pH, Arterial 7.311 (L) 7.350 - 7.450   pCO2 arterial 50.3 (H) 35.0 - 45.0 mmHg   pO2, Arterial 81.3 80.0 - 100.0 mmHg   Bicarbonate 23.2  20.0 - 24.0 mEq/L   TCO2 14.9 0 - 100 mmol/L   Acid-Base Excess 0.7 0.0 - 2.0 mmol/L   O2 Saturation 94.9 %   Patient temperature 37.0    Collection site LEFT RADIAL    Drawn by [id number]    Sample type ARTERIAL    Allens test (pass/fail) PASS PASS  Basic metabolic panel     Status: Abnormal   Collection Time: 05/06/15  4:37 AM  Result Value Ref Range   Sodium 133 (L) 135 - 145 mmol/L   Potassium 4.8 3.5 - 5.1 mmol/L   Chloride 99 (L) 101 - 111 mmol/L   CO2 25 22 - 32 mmol/L   Glucose, Bld 86 65 - 99 mg/dL   BUN 30 (H) 6 - 20 mg/dL   Creatinine, Ser 1.07 0.61 - 1.24 mg/dL   Calcium 8.2 (L) 8.9 - 10.3 mg/dL   GFR calc non Af Amer >60 >60 mL/min   GFR calc Af Amer >60 >60 mL/min    Comment: (NOTE) The eGFR has been calculated using the CKD EPI equation. This calculation has not been validated in all clinical situations. eGFR's persistently <60 mL/min signify possible Chronic Kidney Disease.    Anion gap 9 5 - 15  CBC     Status: Abnormal   Collection Time: 05/06/15  4:37 AM  Result Value Ref Range   WBC 4.4 4.0 - 10.5 K/uL   RBC 1.77 (L) 4.22 - 5.81 MIL/uL   Hemoglobin 7.7 (L) 13.0 - 17.0 g/dL   HCT 21.8 (L) 39.0 - 52.0 %   MCV 123.2 (H) 78.0 - 100.0 fL   MCH 43.5 (H) 26.0 - 34.0 pg   MCHC 35.3 30.0 - 36.0 g/dL   RDW 19.6 (H) 11.5 - 15.5 %   Platelets 95 (L) 150 - 400 K/uL    Comment: SPECIMEN CHECKED FOR CLOTS PLATELET COUNT CONFIRMED BY SMEAR   Hepatic function panel     Status: Abnormal   Collection Time: 05/06/15  4:37 AM  Result Value Ref Range   Total Protein 5.5 (L) 6.5 - 8.1 g/dL   Albumin 2.2 (L) 3.5 - 5.0 g/dL   AST 67 (H) 15 - 41 U/L   ALT 54 17 - 63 U/L   Alkaline Phosphatase 86 38 - 126 U/L   Total Bilirubin 14.4 (H) 0.3 - 1.2 mg/dL   Bilirubin, Direct 6.4 (H) 0.1 - 0.5 mg/dL  Indirect Bilirubin 8.0 (H) 0.3 - 0.9 mg/dL  Glucose, capillary     Status: None   Collection Time: 05/06/15  7:48 AM  Result Value Ref Range   Glucose-Capillary 81 65 - 99 mg/dL    Comment 1 Notify RN    Comment 2 Document in Chart   Ammonia     Status: Abnormal   Collection Time: 05/06/15  7:53 AM  Result Value Ref Range   Ammonia 97 (H) 9 - 35 umol/L  Glucose, capillary     Status: Abnormal   Collection Time: 05/06/15 11:47 AM  Result Value Ref Range   Glucose-Capillary 108 (H) 65 - 99 mg/dL   Comment 1 Notify RN    Comment 2 Document in Chart   Glucose, capillary     Status: Abnormal   Collection Time: 05/06/15  3:42 PM  Result Value Ref Range   Glucose-Capillary 101 (H) 65 - 99 mg/dL  Glucose, capillary     Status: Abnormal   Collection Time: 05/06/15  9:41 PM  Result Value Ref Range   Glucose-Capillary 104 (H) 65 - 99 mg/dL   Comment 1 Notify RN   Prepare RBC     Status: None   Collection Time: 05/06/15 11:00 PM  Result Value Ref Range   Order Confirmation ORDER PROCESSED BY BLOOD BANK   Blood gas, arterial     Status: Abnormal   Collection Time: 05-10-2015  4:00 AM  Result Value Ref Range   FIO2 80.00    Delivery systems BILEVEL POSITIVE AIRWAY PRESSURE    Inspiratory PAP 16    Expiratory PAP 6    pH, Arterial 7.381 7.350 - 7.450   pCO2 arterial 39.5 35.0 - 45.0 mmHg   pO2, Arterial 76.8 (L) 80.0 - 100.0 mmHg   Bicarbonate 23.1 20.0 - 24.0 mEq/L   TCO2 11.4 0 - 100 mmol/L   Acid-Base Excess 1.5 0.0 - 2.0 mmol/L   O2 Saturation 94.8 %   Collection site RIGHT BRACHIAL    Drawn by [id number]    Sample type ARTERIAL    Allens test (pass/fail) PASS PASS  Basic metabolic panel     Status: Abnormal   Collection Time: 05/10/2015  5:26 AM  Result Value Ref Range   Sodium 132 (L) 135 - 145 mmol/L   Potassium 4.5 3.5 - 5.1 mmol/L   Chloride 101 101 - 111 mmol/L   CO2 24 22 - 32 mmol/L   Glucose, Bld 104 (H) 65 - 99 mg/dL   BUN 47 (H) 6 - 20 mg/dL   Creatinine, Ser 1.45 (H) 0.61 - 1.24 mg/dL   Calcium 7.3 (L) 8.9 - 10.3 mg/dL   GFR calc non Af Amer 53 (L) >60 mL/min   GFR calc Af Amer >60 >60 mL/min    Comment: (NOTE) The eGFR has been calculated  using the CKD EPI equation. This calculation has not been validated in all clinical situations. eGFR's persistently <60 mL/min signify possible Chronic Kidney Disease.    Anion gap 7 5 - 15  CBC     Status: Abnormal   Collection Time: May 10, 2015  5:26 AM  Result Value Ref Range   WBC 1.3 (LL) 4.0 - 10.5 K/uL    Comment: RESULT REPEATED AND VERIFIED CRITICAL RESULT CALLED TO, READ BACK BY AND VERIFIED WITH: MCDANIEL,M AT 0710 BY HUFFINES,S O 2015-05-10    RBC 1.79 (L) 4.22 - 5.81 MIL/uL   Hemoglobin 7.5 (L) 13.0 - 17.0 g/dL   HCT 21.4 (L) 39.0 -  52.0 %   MCV 119.6 (H) 78.0 - 100.0 fL   MCH 41.9 (H) 26.0 - 34.0 pg   MCHC 35.0 30.0 - 36.0 g/dL   RDW 25.1 (H) 11.5 - 15.5 %   Platelets 45 (L) 150 - 400 K/uL    Comment: SPECIMEN CHECKED FOR CLOTS  Ammonia     Status: Abnormal   Collection Time: 03-Jun-2015  5:26 AM  Result Value Ref Range   Ammonia 94 (H) 9 - 35 umol/L  Hepatic function panel     Status: Abnormal   Collection Time: 06-03-15  5:26 AM  Result Value Ref Range   Total Protein 4.5 (L) 6.5 - 8.1 g/dL   Albumin 1.8 (L) 3.5 - 5.0 g/dL   AST 94 (H) 15 - 41 U/L   ALT 49 17 - 63 U/L   Alkaline Phosphatase 52 38 - 126 U/L   Total Bilirubin 13.4 (H) 0.3 - 1.2 mg/dL   Bilirubin, Direct 6.8 (H) 0.1 - 0.5 mg/dL   Indirect Bilirubin 6.6 (H) 0.3 - 0.9 mg/dL    ABGS  Recent Labs  06-03-2015 0400  PHART 7.381  PO2ART 76.8*  TCO2 11.4  HCO3 23.1   CULTURES Recent Results (from the past 240 hour(s))  Urine culture     Status: None   Collection Time: 05/09/2015  4:37 AM  Result Value Ref Range Status   Specimen Description URINE, CLEAN CATCH  Final   Special Requests NONE  Final   Culture   Final    MULTIPLE SPECIES PRESENT, SUGGEST RECOLLECTION Performed at [facility]    Report Status 05/05/2015 FINAL  Final  MRSA PCR Screening     Status: None   Collection Time: 05/05/15  4:10 AM  Result Value Ref Range Status   MRSA by PCR NEGATIVE NEGATIVE Final    Comment:         The GeneXpert MRSA Assay (FDA approved for NASAL specimens only), is one component of a comprehensive MRSA colonization surveillance program. It is not intended to diagnose MRSA infection nor to guide or monitor treatment for MRSA infections.    Studies/Results: Dg Chest 1 View  Jun 03, 2015   CLINICAL DATA:  Respiratory distress.  EXAM: CHEST  1 VIEW  COMPARISON:  05/06/2015.  FINDINGS: Prior CABG. Cardiomegaly with progressive bilateral pulmonary alveolar infiltrates consistent with pulmonary edema. Small left pleural effusion. No pneumothorax.  IMPRESSION: Prior CABG. Cardiomegaly with progressive bilateral pulmonary alveolar infiltrates consistent with pulmonary edema. Small left pleural effusion.   Electronically Signed   By: Marcello Moores  Register   On: Jun 03, 2015 07:13   Dg Chest Port 1 View  05/06/2015   CLINICAL DATA:  Follow-up infiltrates  EXAM: PORTABLE CHEST - 1 VIEW  COMPARISON:  9/17/ 16  FINDINGS: Cardiomediastinal silhouette is stable. Status post CABG. Persistent streaky right basilar atelectasis or infiltrate. Worsening left perihilar infiltrate. Small left pleural effusion with left basilar atelectasis or infiltrate. No convincing pulmonary edema.  IMPRESSION: Status post CABG. Persistent streaky right basilar atelectasis or infiltrate. Worsening left perihilar infiltrate. Small left pleural effusion with left basilar atelectasis or infiltrate. No convincing pulmonary edema.   Electronically Signed   By: Lahoma Crocker M.D.   On: 05/06/2015 09:31   Dg Chest Port 1 View  05/05/2015   CLINICAL DATA:  Evaluate central line placement. History of CHF, diabetes, hypertension and coronary artery disease.  EXAM: PORTABLE CHEST - 1 VIEW  COMPARISON:  05/05/2015 at 8:16 a.m.  FINDINGS: Left PICC tip projects at  the caval atrial junction.  Right lower lung zone consolidation is less apparent than on the earlier study, likely due to the erect position on the current exam the supine positioning  previously, as well as different rotation. Left base is not well evaluated on the current exam. Stable cardiomegaly and changes from previous CABG surgery.  IMPRESSION: Left PICC is well positioned with its tip at the caval atrial junction.   Electronically Signed   By: Lajean Manes M.D.   On: 05/05/2015 18:30   Dg Chest Port 1 View  05/05/2015   CLINICAL DATA:  Respiratory failure.  EXAM: PORTABLE CHEST - 1 VIEW  COMPARISON:  04/25/2015  FINDINGS: There is right lower lobe airspace disease. The lungs are otherwise clear. There is no pleural effusion or pneumothorax. There is stable cardiomegaly. There is evidence of prior median sternotomy. There is evidence of prior CABG.  The osseous structures are unremarkable.  IMPRESSION: Right lower lobe airspace disease most concerning for atelectasis versus pneumonia. Follow-up radiography is recommended.   Electronically Signed   By: Kathreen Devoid   On: 05/05/2015 08:52    Medications:  Prior to Admission:  Prescriptions prior to admission  Medication Sig Dispense Refill Last Dose  . albuterol (PROVENTIL HFA;VENTOLIN HFA) 108 (90 BASE) MCG/ACT inhaler Inhale 2 puffs into the lungs every 6 (six) hours as needed for wheezing.   Past Week at Unknown time  . aspirin EC 81 MG tablet Take 81 mg by mouth daily.   05/02/2015 at Unknown time  . atorvastatin (LIPITOR) 40 MG tablet Take 40 mg by mouth daily.   05/02/2015 at Unknown time  . clopidogrel (PLAVIX) 75 MG tablet Take 75 mg by mouth daily.   05/02/2015 at Unknown time  . desvenlafaxine (PRISTIQ) 50 MG 24 hr tablet Take 50 mg by mouth daily.   05/02/2015 at Unknown time  . docusate sodium (COLACE) 100 MG capsule Take 100 mg by mouth daily as needed for mild constipation.    Past Week at Unknown time  . esomeprazole (NEXIUM) 40 MG capsule Take 40 mg by mouth daily before breakfast.   05/02/2015 at Unknown time  . fluticasone (FLONASE) 50 MCG/ACT nasal spray Place 2 sprays into the nose 2 (two) times daily as  needed for rhinitis.   05/02/2015 at Unknown time  . folic acid (FOLVITE) 1 MG tablet Take 1 mg by mouth daily.   05/02/2015 at Unknown time  . ipratropium-albuterol (DUONEB) 0.5-2.5 (3) MG/3ML SOLN Inhale 3 mLs into the lungs daily as needed.   unknown  . magnesium oxide (MAG-OX) 400 MG tablet Take 400 mg by mouth daily.   Past Week at Unknown time  . mercaptopurine (PURINETHOL) 50 MG tablet Take 100 mg by mouth daily. Caution: Chemotherapy.   05/02/2015 at Unknown time  . metoprolol tartrate (LOPRESSOR) 25 MG tablet Take 12.5 mg by mouth 2 (two) times daily.    05/02/2015 at 2000  . morphine (MS CONTIN) 15 MG 12 hr tablet Take 1 tablet by mouth daily as needed for pain.    unknown  . nitroGLYCERIN (NITROSTAT) 0.4 MG SL tablet Place 0.4 mg under the tongue every 5 (five) minutes as needed for chest pain.   unknown  . potassium chloride (K-DUR) 10 MEQ tablet Take 20 mEq by mouth daily as needed (weakness).    Past Month at Unknown time  . PRESCRIPTION MEDICATION Take 5 mLs by mouth every 6 (six) hours as needed (throat pain). [place] mouthwash suspension.   05/02/2015  at Unknown time  . tadalafil (CIALIS) 5 MG tablet Take 5 mg by mouth as needed for erectile dysfunction.   unknown  . tamsulosin (FLOMAX) 0.4 MG CAPS capsule Take 0.4 mg by mouth daily.    05/02/2015 at Unknown time  . vardenafil (LEVITRA) 20 MG tablet Take 20 mg by mouth as needed for erectile dysfunction.   unknown  . vitamin B-12 (CYANOCOBALAMIN) 1000 MCG tablet Take 1,000 mcg by mouth daily.   Past Week at Unknown time   Scheduled: . sodium chloride   Intravenous Once  . albuterol  2.5 mg Nebulization Q4H  . antiseptic oral rinse  7 mL Mouth Rinse q12n4p  . chlorhexidine  15 mL Mouth Rinse BID  . desvenlafaxine  50 mg Oral Daily  . fluticasone  2 spray Each Nare Daily  . folic acid  1 mg Intravenous Daily  . insulin aspart  0-9 Units Subcutaneous TID WC  . lactulose  300 mL Rectal TID  . magic mouthwash  15 mL Oral QID    And  . lidocaine  15 mL Mouth/Throat QID  . methylPREDNISolone (SOLU-MEDROL) injection  40 mg Intravenous Q6H  . piperacillin-tazobactam (ZOSYN)  IV  3.375 g Intravenous Q8H  . sodium chloride  10-40 mL Intracatheter Q12H  . sodium chloride  3 mL Intravenous Q12H  . thiamine  100 mg Intravenous Daily   Continuous: . dextrose 5 % and 0.9% NaCl 65 mL/hr at 06-01-2015 0000   OZD:GUYQIH chloride, ipratropium-albuterol, metoprolol, naLOXone (NARCAN)  injection, nitroGLYCERIN, ondansetron **OR** ondansetron (ZOFRAN) IV, sodium chloride, sodium chloride  Assesment: He has multiple medical problems including liver failure. He has a history of esophageal and gastric varices, chronic alcoholic cirrhosis and coagulopathy.   He has respiratory failure on BiPAP and is requiring 80% oxygen.  He has a history of throat cancer and is having trouble managing even ice chips and appears to be aspirating that.  He is now becoming more hypotensive. He has no CODE BLUE status but it may be more appropriate now to switch to comfort care. I'm not sure of what his family wants to do as far as this situation and whether they would like to go ahead with pressors etc. Principal Problem:   Liver failure Active Problems:   CAD (coronary artery disease)   Essential hypertension   Obesity   Diabetes mellitus without complication   Ulcerative colitis   COPD (chronic obstructive pulmonary disease)   Throat cancer   Thrombocytopenia   Varices, esophageal   Varices, gastric   Alcoholic cirrhosis of liver   Hyperbilirubinemia   Ascites   Splenomegaly   Liver failure, acute   Elevated LFTs   Odynophagia   Abdominal pain, generalized   Other pancytopenia   Diastolic dysfunction   Pulmonary hypertension   Coagulopathy   Acute respiratory failure with hypercapnia   Encephalopathy, hepatic   Aspiration pneumonia   Aortic stenosis, moderate    Plan: Continue treatments. Discuss with Dr. Caryn Section hospitalist  attending    LOS: 4 days   HAWKINS,EDWARD L 01-Jun-2015, 7:51 AM

## 2015-05-19 DEATH — deceased

## 2016-03-23 IMAGING — CR DG CHEST 1V PORT
1 series · 2 of 2 positions shown · non-contrast
Comparison: 05/03/2015

CLINICAL DATA: Respiratory failure.

EXAM:
PORTABLE CHEST - 1 VIEW

[Series 1: ap portable · 0.17mm/px · 2 of 2 slices shown]
[im 1/2]
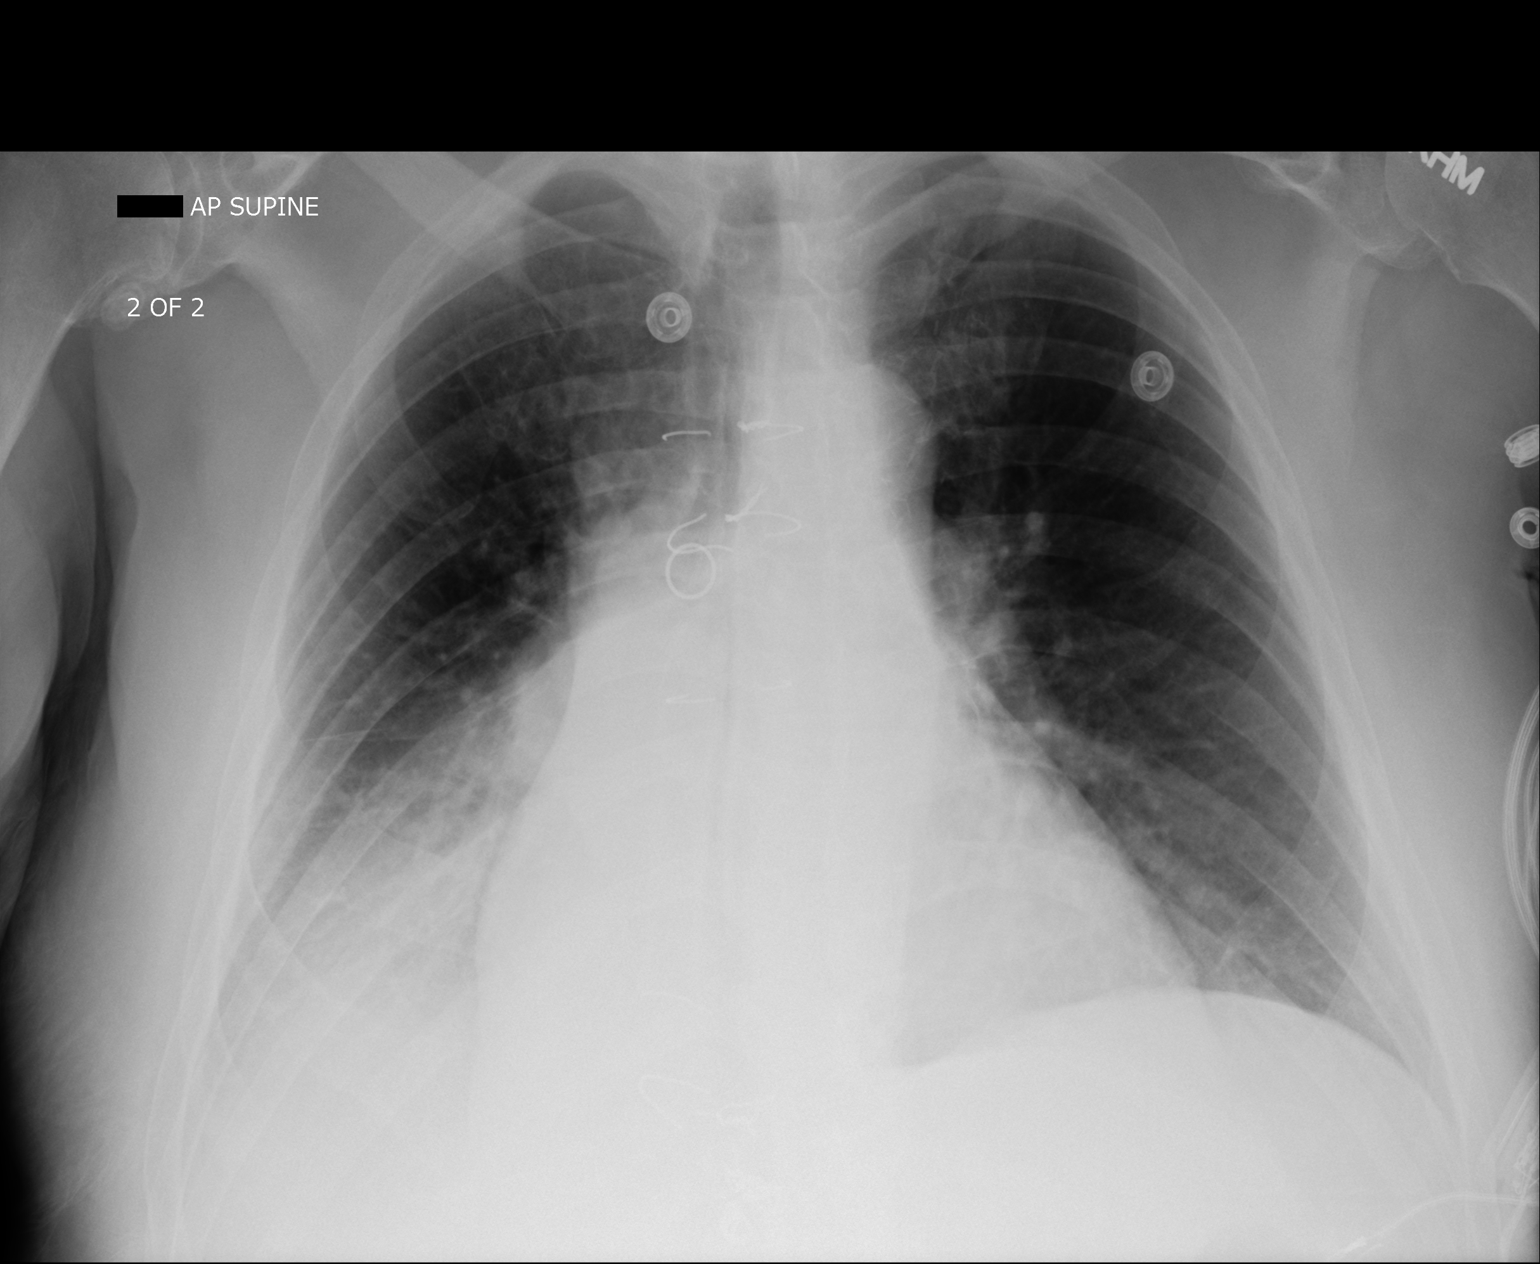
[im 2/2]
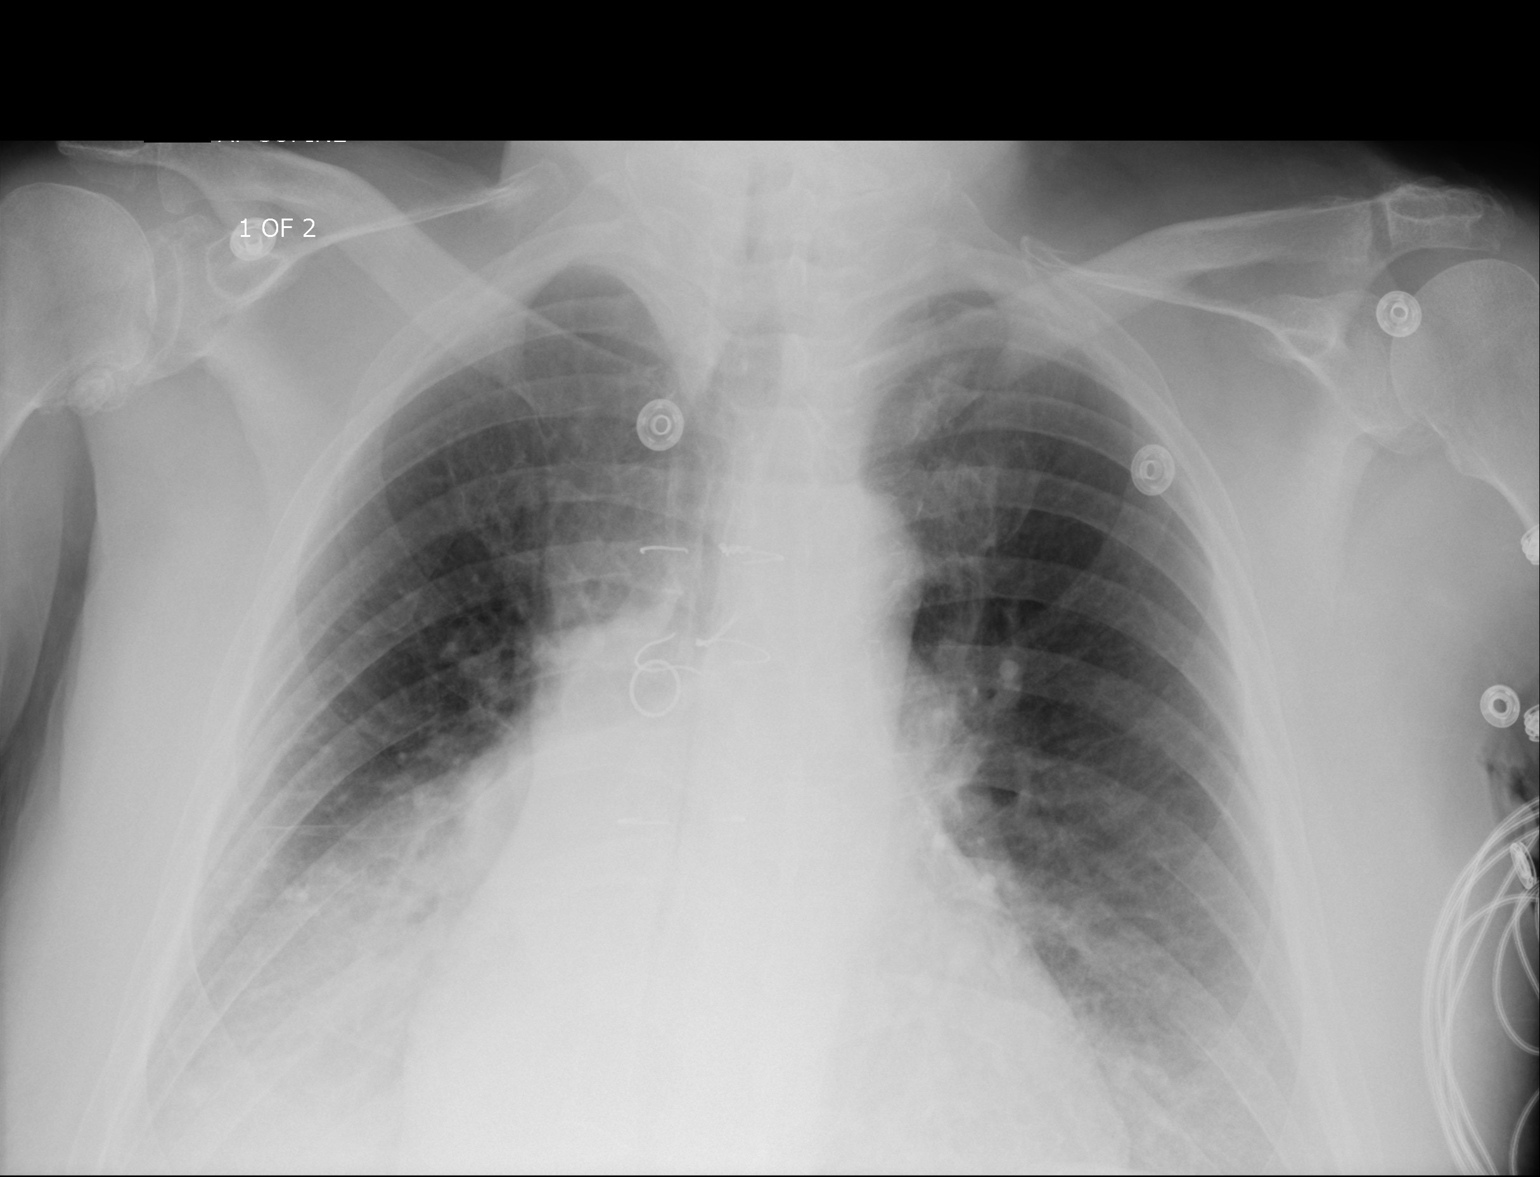

[2 of 2 positions shown; findings below may reference images not displayed]

FINDINGS: There is right lower lobe airspace disease. The lungs are otherwise
clear. There is no pleural effusion or pneumothorax. There is stable
cardiomegaly. There is evidence of prior median sternotomy. There is
evidence of prior CABG.

The osseous structures are unremarkable.
IMPRESSION: Right lower lobe airspace disease most concerning for atelectasis
versus pneumonia. Follow-up radiography is recommended.

## 2016-03-25 IMAGING — CR DG CHEST 1V
1 series · 1 of 1 positions shown · non-contrast
Comparison: 05/06/2015.

CLINICAL DATA: Respiratory distress.

EXAM:
CHEST  1 VIEW

[ap]
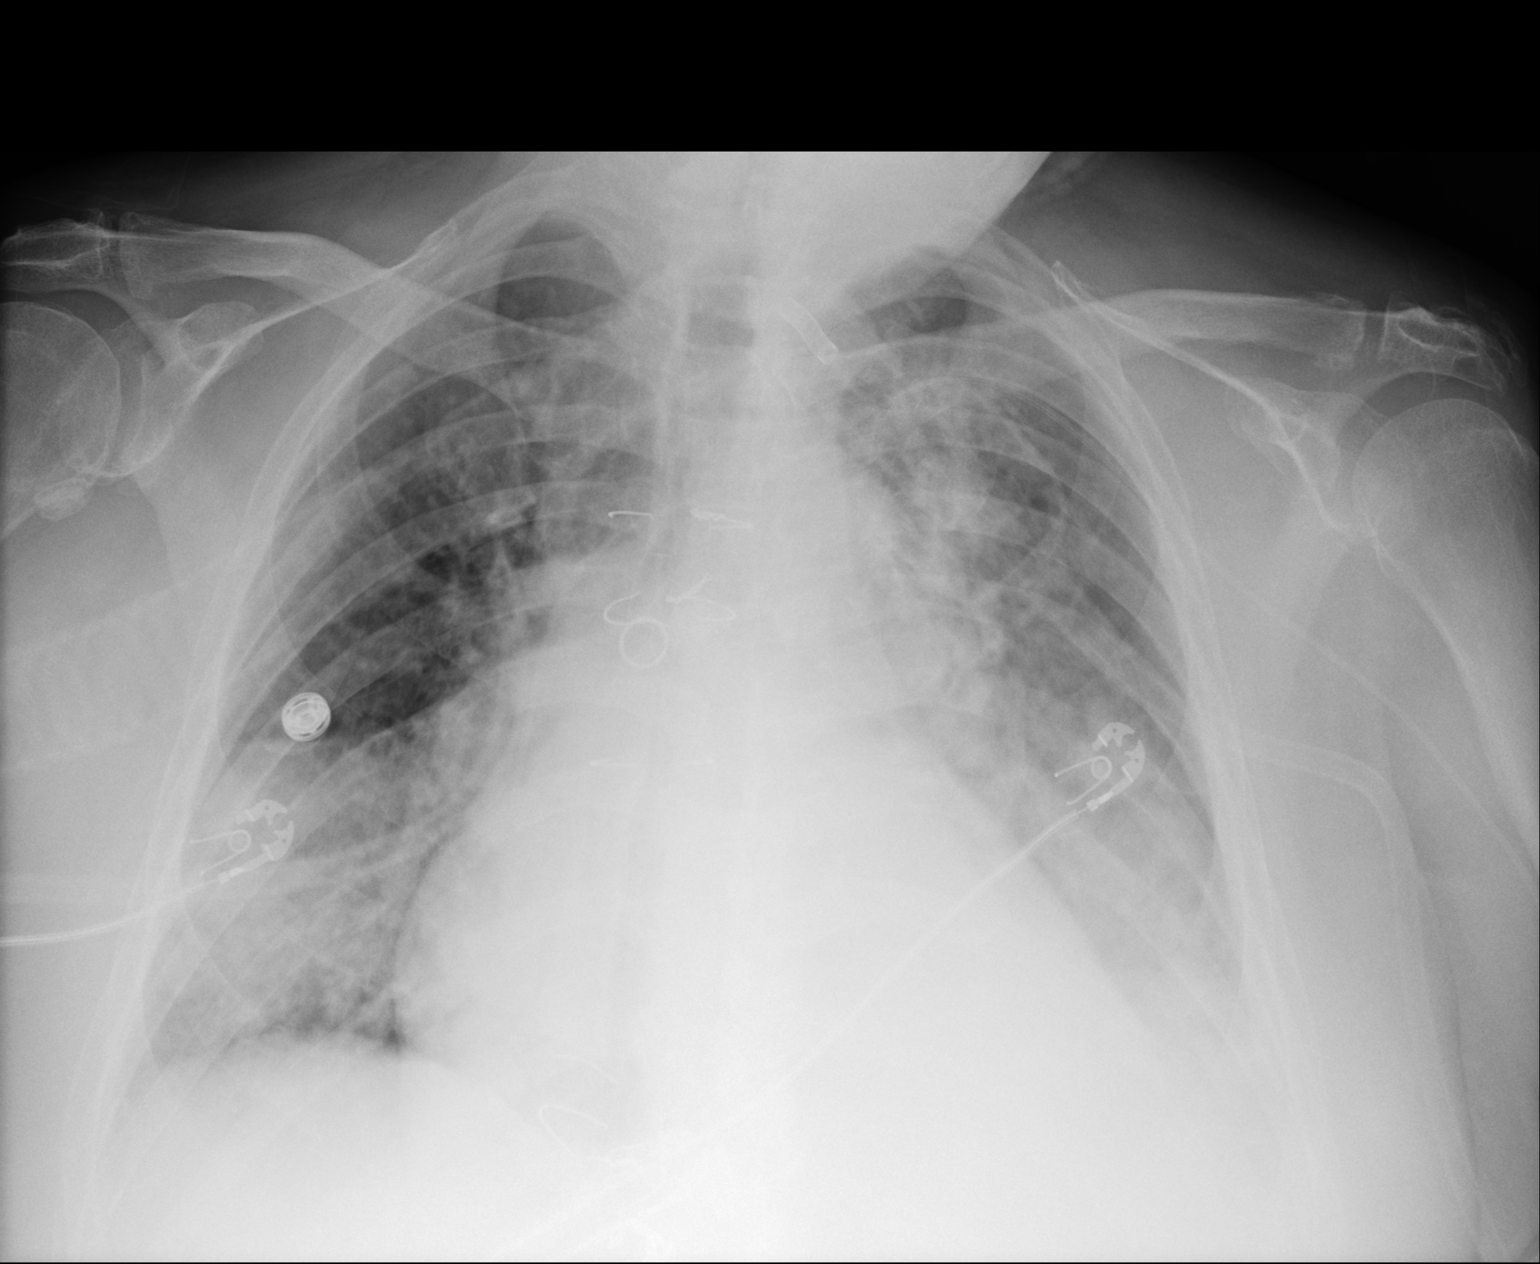

[1 of 1 positions shown; findings below may reference images not displayed]

FINDINGS: Prior CABG. Cardiomegaly with progressive bilateral pulmonary
alveolar infiltrates consistent with pulmonary edema. Small left
pleural effusion. No pneumothorax.
IMPRESSION: Prior CABG. Cardiomegaly with progressive bilateral pulmonary
alveolar infiltrates consistent with pulmonary edema. Small left
pleural effusion.
